# Patient Record
Sex: Female | Born: 1950 | Race: White | Hispanic: No | Smoking: Never smoker
Health system: Southern US, Community
[De-identification: ages and names within clinical notes are randomized; demographics above are authoritative.]

## PROBLEM LIST (undated history)

## (undated) DIAGNOSIS — E7800 Pure hypercholesterolemia, unspecified: Secondary | ICD-10-CM

## (undated) DIAGNOSIS — H269 Unspecified cataract: Secondary | ICD-10-CM

## (undated) DIAGNOSIS — I10 Essential (primary) hypertension: Secondary | ICD-10-CM

## (undated) DIAGNOSIS — E119 Type 2 diabetes mellitus without complications: Secondary | ICD-10-CM

## (undated) DIAGNOSIS — M8580 Other specified disorders of bone density and structure, unspecified site: Secondary | ICD-10-CM

## (undated) HISTORY — PX: OOPHORECTOMY: SHX6387

## (undated) HISTORY — DX: Other specified disorders of bone density and structure, unspecified site: M85.80

## (undated) HISTORY — DX: Unspecified cataract: H26.9

---

## 1998-03-22 ENCOUNTER — Other Ambulatory Visit: Admission: RE | Admit: 1998-03-22 | Discharge: 1998-03-22 | Payer: Self-pay | Admitting: Obstetrics and Gynecology

## 1999-06-10 ENCOUNTER — Other Ambulatory Visit: Admission: RE | Admit: 1999-06-10 | Discharge: 1999-06-10 | Payer: Self-pay | Admitting: Obstetrics and Gynecology

## 2000-11-02 ENCOUNTER — Other Ambulatory Visit: Admission: RE | Admit: 2000-11-02 | Discharge: 2000-11-02 | Payer: Self-pay | Admitting: Obstetrics and Gynecology

## 2001-11-03 ENCOUNTER — Other Ambulatory Visit: Admission: RE | Admit: 2001-11-03 | Discharge: 2001-11-03 | Payer: Self-pay | Admitting: Obstetrics and Gynecology

## 2001-11-04 ENCOUNTER — Ambulatory Visit (HOSPITAL_COMMUNITY): Admission: RE | Admit: 2001-11-04 | Discharge: 2001-11-04 | Payer: Self-pay | Admitting: Obstetrics and Gynecology

## 2001-11-04 ENCOUNTER — Encounter (INDEPENDENT_AMBULATORY_CARE_PROVIDER_SITE_OTHER): Payer: Self-pay

## 2001-11-15 ENCOUNTER — Encounter: Payer: Self-pay | Admitting: Obstetrics and Gynecology

## 2001-11-15 ENCOUNTER — Encounter: Admission: RE | Admit: 2001-11-15 | Discharge: 2001-11-15 | Payer: Self-pay | Admitting: Obstetrics and Gynecology

## 2002-11-08 ENCOUNTER — Other Ambulatory Visit: Admission: RE | Admit: 2002-11-08 | Discharge: 2002-11-08 | Payer: Self-pay | Admitting: Obstetrics and Gynecology

## 2003-11-15 ENCOUNTER — Other Ambulatory Visit: Admission: RE | Admit: 2003-11-15 | Discharge: 2003-11-15 | Payer: Self-pay | Admitting: Obstetrics and Gynecology

## 2004-12-06 ENCOUNTER — Other Ambulatory Visit: Admission: RE | Admit: 2004-12-06 | Discharge: 2004-12-06 | Payer: Self-pay | Admitting: Obstetrics and Gynecology

## 2010-09-27 NOTE — Op Note (Signed)
[facility]  Patient:    Natasha Henry, Natasha Henry [facility] Visit Number: [id number] MRN: [id number]          Service Type: DSU Location: [facility] Attending Physician:  Rhina Brackett Dictated by:   Duke Salvia. Marcelle Overlie, M.D. Proc. Date: 11/04/01 Admit Date:  11/04/2001 Discharge Date: 11/04/2001                             Operative Report  PREOPERATIVE DIAGNOSES:       1. Abnormal uterine bleeding.                               2. Endometrial polyps.  POSTOPERATIVE DIAGNOSES:      1. Abnormal uterine bleeding.                               2. Endometrial polyps.  OPERATION:                    Dilatation and curettage, hysteroscopy.  SURGEON:                      Duke Salvia. Marcelle Overlie, M.D.  ANESTHESIA:                   Paracervical block plus MAC.  DESCRIPTION OF PROCEDURE:     The patient was taken to the operating room and after an adequate level of sedation was obtained with the patients legs in stirrups, the peritoneum and vagina were prepped and draped in the usual manner for D&C.  The bladder was drained. Examination under anesthesia carried out.  The uterus was anterior, normal size.  Adnexa negative.  Speculum was positioned.  The cervix was grasped with a tenaculum.  Paracervical block created by infiltrating at 3 and 9 oclock submucosally.  Then 5-7 cc of 1% Xylocaine on either side after negative aspiration.  The uterus was then sounded to 9 cm and progressively dilated to a 29 Pratt. The continuous flow 7 mm diagnostic hysteroscope was then used to explore the cavity revealing some polypoid type tissue obscuring the view.  D&C was carried out removing a moderate amount of tissue.  After this was completed, the scope was reinserted.  The cavity was rinsed clean and reinspected and noted to be clean.  It was a spacious cavity but was normal.  There were no remaining polyps or abnormalities.  Minimal bleeding.  She tolerated this well and went to  the recovery room in good condition. Dictated by:   Duke Salvia. Marcelle Overlie, M.D. Attending Physician:  Rhina Brackett DD:  11/04/01 TD:  11/06/01 Job: [id number]

## 2010-09-27 NOTE — H&P (Signed)
[facility]  Patient:    Natasha Henry, Natasha Henry [facility] Visit Number: [id number] MRN: [id number]          Service Type: DSU Location: [facility] Attending Physician:  Rhina Brackett Dictated by:   Duke Salvia. Marcelle Overlie, M.D. Admit Date:  11/04/2001                           History and Physical  CHIEF COMPLAINT:              Abnormal bleeding, endometrial polyps.  HISTORY OF PRESENT ILLNESS:   A 60 year old G2 P2 postmenopausal patient who has been on Activelle for perimenopausal vasomotor symptoms.  She has had a prior tubal ligation, recently has had some abnormal bleeding which was investigated by [facility] that showed small posterior fibroid that was approximately 1 cm, adnexa unremarkable, but on saline infusion what appeared to be two small 3.5 to 4 mm well-defined polyps.  She presents at this time for a D&C hysteroscopy.  This procedure including risks of bleeding, infection, other complications that may require open or addition surgery all reviewed with her which she understands and accepts.  PAST MEDICAL HISTORY:  ALLERGIES:                    CODEINE.  MEDICATIONS:                  Activelle, baby aspirin q.d., vitamin E, calcium supplement daily, Claritin, and Flonase.  SURGERY:                      In 1992 had an oophorectomy after a ruptured ovarian cyst.  She has also had a prior D&C for postpartum bleeding.  OBSTETRICAL HISTORY:          Two vaginal deliveries at term without complications.  FAMILY HISTORY:               Significant for her mother and father both with hypertension.  PHYSICAL EXAMINATION:  VITAL SIGNS:                  Temperature 98.2, blood pressure 150/80.  HEENT:                        Unremarkable.  NECK:                         Supple without masses.  LUNGS:                        Clear.  CARDIOVASCULAR:               Regular rate and rhythm without murmurs, rubs, or gallops noted.  BREASTS:                       Without masses.  ABDOMEN:                      Soft, flat, and nontender.  PELVIC:                       Normal external genitalia, vagina and cervix clear.  Uterus midposition, normal size.  Adnexa negative.  IMPRESSION:                   Abnormal uterine bleeding, endometrial  polyps.  PLAN:                         D&C hysteroscopy.  Procedure and risks reviewed as above. Dictated by:   Duke Salvia. Marcelle Overlie, M.D. Attending Physician:  Rhina Brackett DD:  11/03/01 TD:  11/04/01 Job: [id number]

## 2012-03-31 ENCOUNTER — Other Ambulatory Visit: Payer: Self-pay | Admitting: Physician Assistant

## 2012-03-31 DIAGNOSIS — E119 Type 2 diabetes mellitus without complications: Secondary | ICD-10-CM

## 2012-04-05 ENCOUNTER — Other Ambulatory Visit: Payer: Self-pay

## 2012-04-19 ENCOUNTER — Ambulatory Visit
Admission: RE | Admit: 2012-04-19 | Discharge: 2012-04-19 | Disposition: A | Discharge status: Home / Self Care | Payer: BC Managed Care – PPO | Source: Ambulatory Visit | Attending: Physician Assistant | Admitting: Physician Assistant

## 2012-04-19 ENCOUNTER — Other Ambulatory Visit: Payer: Self-pay | Admitting: Physician Assistant

## 2012-04-19 DIAGNOSIS — E119 Type 2 diabetes mellitus without complications: Secondary | ICD-10-CM

## 2013-12-13 ENCOUNTER — Other Ambulatory Visit: Payer: Self-pay | Admitting: Obstetrics and Gynecology

## 2013-12-15 LAB — CYTOLOGY - PAP

## 2014-03-30 ENCOUNTER — Encounter (HOSPITAL_BASED_OUTPATIENT_CLINIC_OR_DEPARTMENT_OTHER): Payer: Self-pay | Admitting: *Deleted

## 2014-03-30 ENCOUNTER — Emergency Department (HOSPITAL_BASED_OUTPATIENT_CLINIC_OR_DEPARTMENT_OTHER): Payer: Worker's Compensation

## 2014-03-30 ENCOUNTER — Emergency Department (HOSPITAL_BASED_OUTPATIENT_CLINIC_OR_DEPARTMENT_OTHER)
Admission: EM | Admit: 2014-03-30 | Discharge: 2014-03-30 | Disposition: A | Discharge status: Home / Self Care | Payer: Worker's Compensation | Attending: Emergency Medicine | Admitting: Emergency Medicine

## 2014-03-30 DIAGNOSIS — Y92218 Other school as the place of occurrence of the external cause: Secondary | ICD-10-CM | POA: Insufficient documentation

## 2014-03-30 DIAGNOSIS — Z79899 Other long term (current) drug therapy: Secondary | ICD-10-CM | POA: Insufficient documentation

## 2014-03-30 DIAGNOSIS — Y998 Other external cause status: Secondary | ICD-10-CM | POA: Diagnosis not present

## 2014-03-30 DIAGNOSIS — S5002XA Contusion of left elbow, initial encounter: Secondary | ICD-10-CM | POA: Diagnosis not present

## 2014-03-30 DIAGNOSIS — I10 Essential (primary) hypertension: Secondary | ICD-10-CM | POA: Insufficient documentation

## 2014-03-30 DIAGNOSIS — S8002XA Contusion of left knee, initial encounter: Secondary | ICD-10-CM | POA: Diagnosis not present

## 2014-03-30 DIAGNOSIS — S8992XA Unspecified injury of left lower leg, initial encounter: Secondary | ICD-10-CM | POA: Diagnosis present

## 2014-03-30 DIAGNOSIS — W010XXA Fall on same level from slipping, tripping and stumbling without subsequent striking against object, initial encounter: Secondary | ICD-10-CM | POA: Insufficient documentation

## 2014-03-30 DIAGNOSIS — W19XXXA Unspecified fall, initial encounter: Secondary | ICD-10-CM

## 2014-03-30 DIAGNOSIS — S93401A Sprain of unspecified ligament of right ankle, initial encounter: Secondary | ICD-10-CM

## 2014-03-30 DIAGNOSIS — Y9301 Activity, walking, marching and hiking: Secondary | ICD-10-CM | POA: Insufficient documentation

## 2014-03-30 DIAGNOSIS — E119 Type 2 diabetes mellitus without complications: Secondary | ICD-10-CM | POA: Diagnosis not present

## 2014-03-30 HISTORY — DX: Essential (primary) hypertension: I10

## 2014-03-30 HISTORY — DX: Type 2 diabetes mellitus without complications: E11.9

## 2014-03-30 MED ORDER — IBUPROFEN 400 MG PO TABS
400.0000 mg | ORAL_TABLET | Freq: Once | ORAL | Status: AC
  Administered 2014-03-30: 400 mg via ORAL
  Filled 2014-03-30: qty 1

## 2014-03-30 NOTE — ED Notes (Signed)
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  Pt. In no distress.  Noted small abrasion noted with bruise on the L elbow and large bruise and abrasion on the L knee.

## 2014-03-30 NOTE — ED Notes (Signed)
No Crutches given to pt.

## 2014-03-30 NOTE — ED Provider Notes (Signed)
CSN: [id number]     Arrival date & time 03/30/14  1921 History   First MD Initiated Contact with Patient 03/30/14 2023     This chart was scribed for No att. providers found by Natasha OrganAshley Henry, ED Scribe. This patient was seen in room MHOTF/OTF and the patient's care was started 12:20 PM.   Chief Complaint  Patient presents with  . Fall   Patient is a 63 y.o. female presenting with fall. The history is provided by the patient. No language interpreter was used.  Fall This is a new problem. The current episode started 6 to 12 hours ago. The problem occurs rarely. The problem has been gradually worsening. Pertinent negatives include no chest pain, no abdominal pain, no headaches and no shortness of breath. The symptoms are relieved by ice. She has tried a cold compress for the symptoms. The treatment provided mild relief.    HPI Comments: Natasha Henry is a 63 y.o. female with a PMHx of DM and HTN who presents to the Emergency Department complaining of a fall that occurred this afternoon at approximately 2:30 PM this afternoon. 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 After fall pt landed in her L knee and L elbow. No head trauma or LOC. She reports small abrasions to the L elbow and L knee along with some bruising. She also c/o constant, moderate pain to L knee and R ankle. Ms. Natasha Henry currently rates pain 6/10. Pt has applied ice to areas with mild improvement for symptoms. She denies any L ankle pain or hip pain. No known allergies to medications.  Past Medical History  Diagnosis Date  . Diabetes mellitus without complication   . Hypertension    Past Surgical History  Procedure Laterality Date  . Oophorectomy     No family history on file. History  Substance Use Topics  . Smoking status: Not on file  . Smokeless tobacco: Not on file  . Alcohol Use: Not on file   OB History    No data available     Review of Systems  Respiratory: Negative for shortness of breath.   Cardiovascular: Negative for chest pain.  Gastrointestinal: Negative for abdominal pain.  Neurological: Negative for headaches.      Allergies  Review of patient's allergies indicates no known allergies.  Home Medications   Prior to Admission medications   Medication Sig Start Date End Date Taking? Authorizing Provider  metFORMIN (GLUCOPHAGE) 500 MG tablet Take by mouth 2 (two) times daily with a meal.   Yes Historical Provider, MD  ramipril (ALTACE) 5 MG capsule Take 5 mg by mouth daily.   Yes Historical Provider, MD  rosuvastatin (CRESTOR) 20 MG tablet Take 20 mg by mouth daily.   Yes Historical Provider, MD  sitaGLIPtin (JANUVIA) 25 MG tablet Take 25 mg by mouth daily.   Yes Historical Provider, MD   Triage Vitals: BP 172/73 mmHg  Pulse 74  Temp(Src) 98.2 F (36.8 C) (Oral)  Resp 18  Ht 5\' 1"  (1.549 m)  Wt 151 lb (68.493 kg)  BMI 28.55 kg/m2  SpO2 100%   Physical Exam  ED Course  Procedures (including critical care time)  DIAGNOSTIC STUDIES:   COORDINATION OF CARE: 12:20 PM-Discussed treatment plan with pt at bedside and pt agreed to plan.     Labs Review Labs Reviewed - No data to display  Imaging Review  Dg Elbow Complete Left  03/30/2014   CLINICAL DATA:  Pain and tenderness at the posterior aspect of the elbow secondary to a fall today. The patient slipped in wet leaves.  EXAM: LEFT ELBOW - COMPLETE 3+ VIEW  COMPARISON:  None.  FINDINGS: There is no evidence of fracture, dislocation, or joint effusion. There is no evidence of arthropathy or other focal bone abnormality. Soft tissues are unremarkable.  IMPRESSION: Normal exam.   Electronically Signed   By: Geanie CooleyJim  Henry M.D.   On: 03/30/2014 21:40   Dg Ankle Complete Right  03/30/2014   CLINICAL DATA:  Slipped and fell while walking. Twisted right ankle. Lateral right ankle pain.  EXAM: RIGHT ANKLE - COMPLETE 3+ VIEW   COMPARISON:  None.  FINDINGS: There is no evidence of fracture, dislocation, or joint effusion. There is no evidence of arthropathy or other focal bone abnormality. Soft tissues are unremarkable.  IMPRESSION: Negative.   Electronically Signed   By: Burman NievesWilliam  Henry M.D.   On: 03/30/2014 21:35   Dg Knee Complete 4 Views Left  03/30/2014   CLINICAL DATA:  Status post fall, slipped on wet leaves. Initial encounter.  EXAM: LEFT KNEE - COMPLETE 4+ VIEW  COMPARISON:  None.  FINDINGS: There is no evidence of fracture, dislocation, or joint effusion. There is no evidence of arthropathy or other focal bone abnormality. Soft tissues are unremarkable.  IMPRESSION: No acute osseous injury of the left knee.   Electronically Signed   By: Natasha KoHetal  Henry   On: 03/30/2014 21:40     EKG Interpretation None      MDM   Final diagnoses:  Fall  Knee contusion, left, initial encounter  Left elbow contusion, initial encounter  Right ankle sprain, initial encounter    Pt is a 63 y.o. female with Pmhx as above who presents with L elbow, L knee, and R ankle pain after mechanical fall. No head injury or LOC. On PE, VSS, pt in NAD. She has contusion over elbow, L patella, and ttp over R lateral malleolus. XR's negative. Will d/c home w/ supportive care. Return precautions given for new or worsening symptoms including worsening pain.    I personally performed the services described in this documentation, which was scribed in my presence. The recorded information has been reviewed and is accurate.    Natasha CookeyMegan Yamen Castrogiovanni, MD 03/31/14 [phone]

## 2014-03-30 NOTE — Discharge Instructions (Signed)
Contusion °A contusion is a deep bruise. Contusions are the result of an injury that caused bleeding under the skin. The contusion may turn blue, purple, or yellow. Minor injuries will give you a painless contusion, but more severe contusions may stay painful and swollen for a few weeks.  °CAUSES  °A contusion is usually caused by a blow, trauma, or direct force to an area of the body. °SYMPTOMS  °· Swelling and redness of the injured area. °· Bruising of the injured area. °· Tenderness and soreness of the injured area. °· Pain. °DIAGNOSIS  °The diagnosis can be made by taking a history and physical exam. An X-ray, CT scan, or MRI may be needed to determine if there were any associated injuries, such as fractures. °TREATMENT  °Specific treatment will depend on what area of the body was injured. In general, the best treatment for a contusion is resting, icing, elevating, and applying cold compresses to the injured area. Over-the-counter medicines may also be recommended for pain control. Ask your caregiver what the best treatment is for your contusion. °HOME CARE INSTRUCTIONS  °· Put ice on the injured area. °¨ Put ice in a plastic bag. °¨ Place a towel between your skin and the bag. °¨ Leave the ice on for 15-20 minutes, 3-4 times a day, or as directed by your health care provider. °· Only take over-the-counter or prescription medicines for pain, discomfort, or fever as directed by your caregiver. Your caregiver may recommend avoiding anti-inflammatory medicines (aspirin, ibuprofen, and naproxen) for 48 hours because these medicines may increase bruising. °· Rest the injured area. °· If possible, elevate the injured area to reduce swelling. °SEEK IMMEDIATE MEDICAL CARE IF:  °· You have increased bruising or swelling. °· You have pain that is getting worse. °· Your swelling or pain is not relieved with medicines. °MAKE SURE YOU:  °· Understand these instructions. °· Will watch your condition. °· Will get help right  away if you are not doing well or get worse. °Document Released: 02/05/2005 Document Revised: 05/03/2013 Document Reviewed: 03/03/2011 °ExitCare® Patient Information ©2015 [employer]. This information is not intended to replace advice given to you by your health care provider. Make sure you discuss any questions you have with your health care provider. ° °

## 2014-11-17 ENCOUNTER — Encounter (HOSPITAL_COMMUNITY): Payer: Self-pay | Admitting: Emergency Medicine

## 2014-11-17 ENCOUNTER — Emergency Department (HOSPITAL_COMMUNITY)
Admission: EM | Admit: 2014-11-17 | Discharge: 2014-11-17 | Disposition: A | Discharge status: Home / Self Care | Payer: BC Managed Care – PPO | Attending: Emergency Medicine | Admitting: Emergency Medicine

## 2014-11-17 ENCOUNTER — Emergency Department (HOSPITAL_COMMUNITY): Payer: BC Managed Care – PPO

## 2014-11-17 DIAGNOSIS — Y92410 Unspecified street and highway as the place of occurrence of the external cause: Secondary | ICD-10-CM | POA: Diagnosis not present

## 2014-11-17 DIAGNOSIS — S4992XA Unspecified injury of left shoulder and upper arm, initial encounter: Secondary | ICD-10-CM | POA: Insufficient documentation

## 2014-11-17 DIAGNOSIS — Y9389 Activity, other specified: Secondary | ICD-10-CM | POA: Diagnosis not present

## 2014-11-17 DIAGNOSIS — Y998 Other external cause status: Secondary | ICD-10-CM | POA: Diagnosis not present

## 2014-11-17 DIAGNOSIS — R0789 Other chest pain: Secondary | ICD-10-CM

## 2014-11-17 DIAGNOSIS — S29001A Unspecified injury of muscle and tendon of front wall of thorax, initial encounter: Secondary | ICD-10-CM | POA: Insufficient documentation

## 2014-11-17 DIAGNOSIS — R079 Chest pain, unspecified: Secondary | ICD-10-CM

## 2014-11-17 DIAGNOSIS — S199XXA Unspecified injury of neck, initial encounter: Secondary | ICD-10-CM | POA: Insufficient documentation

## 2014-11-17 DIAGNOSIS — I10 Essential (primary) hypertension: Secondary | ICD-10-CM | POA: Insufficient documentation

## 2014-11-17 DIAGNOSIS — Z79899 Other long term (current) drug therapy: Secondary | ICD-10-CM | POA: Diagnosis not present

## 2014-11-17 DIAGNOSIS — E119 Type 2 diabetes mellitus without complications: Secondary | ICD-10-CM | POA: Insufficient documentation

## 2014-11-17 DIAGNOSIS — M542 Cervicalgia: Secondary | ICD-10-CM

## 2014-11-17 HISTORY — DX: Pure hypercholesterolemia, unspecified: E78.00

## 2014-11-17 MED ORDER — IBUPROFEN 800 MG PO TABS: 800.0000 mg | ORAL_TABLET | Freq: Three times a day (TID) | ORAL | Status: DC

## 2014-11-17 MED ORDER — CYCLOBENZAPRINE HCL 10 MG PO TABS: 10.0000 mg | ORAL_TABLET | Freq: Two times a day (BID) | ORAL | Status: AC | PRN

## 2014-11-17 MED ORDER — ONDANSETRON HCL 4 MG PO TABS
4.0000 mg | ORAL_TABLET | Freq: Once | ORAL | Status: AC
  Administered 2014-11-17: 4 mg via ORAL
  Filled 2014-11-17: qty 1

## 2014-11-17 MED ORDER — DIAZEPAM 5 MG PO TABS: 5.0000 mg | ORAL_TABLET | Freq: Two times a day (BID) | ORAL | Status: DC

## 2014-11-17 MED ORDER — HYDROCODONE-ACETAMINOPHEN 5-325 MG PO TABS
2.0000 | ORAL_TABLET | Freq: Once | ORAL | Status: AC
  Administered 2014-11-17: 2 via ORAL
  Filled 2014-11-17: qty 2

## 2014-11-17 MED ORDER — HYDROCODONE-ACETAMINOPHEN 5-325 MG PO TABS: 2.0000 | ORAL_TABLET | ORAL | Status: DC | PRN

## 2014-11-17 MED ORDER — DIAZEPAM 5 MG/ML IJ SOLN
5.0000 mg | Freq: Once | INTRAMUSCULAR | Status: AC
  Administered 2014-11-17: 5 mg via INTRAMUSCULAR
  Filled 2014-11-17: qty 2
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
## 2021-10-14 ENCOUNTER — Encounter: Payer: Self-pay | Admitting: Cardiology

## 2021-10-14 ENCOUNTER — Ambulatory Visit (INDEPENDENT_AMBULATORY_CARE_PROVIDER_SITE_OTHER): Payer: Medicare PPO | Admitting: Cardiology

## 2021-10-14 VITALS — BP 126/64 | HR 98 | Ht 61.0 in | Wt 138.6 lb

## 2021-10-14 DIAGNOSIS — E8881 Metabolic syndrome: Secondary | ICD-10-CM

## 2021-10-14 DIAGNOSIS — I10 Essential (primary) hypertension: Secondary | ICD-10-CM

## 2021-10-14 DIAGNOSIS — R011 Cardiac murmur, unspecified: Secondary | ICD-10-CM

## 2021-10-14 NOTE — Patient Instructions (Addendum)
Medication Instructions:   No changes     Lab Work: Not needed    Testing/Procedures: Will be schedule at [facility] [address] Your physician has requested that you have an echocardiogram. Echocardiography is a painless test that uses sound waves to create images of your heart. It provides your doctor with information about the size and shape of your heart and how well your heart's chambers and valves are working. This procedure takes approximately one hour. There are no restrictions for this procedure.   Follow-Up: At [facility], you and your health needs are our priority.  As part of our continuing mission to provide you with exceptional heart care, we have created designated Provider Care Teams.  These Care Teams include your primary Cardiologist (physician) and Advanced Practice Providers (APPs -  Physician Assistants and Nurse Practitioners) who all work together to provide you with the care you need, when you need it.     Your next appointment:   4 month(s)  The format for your next appointment:   In Person  Provider:   Bryan Lemma, MD

## 2021-10-14 NOTE — Progress Notes (Signed)
Primary Care Provider: Iona Hansen, NP [facility] Cardiologist: Bryan Lemma, MD Electrophysiologist: None  Clinic Note: Chief Complaint  Patient presents with   New Patient (Initial Visit)    Evaluate murmur    ===================================  ASSESSMENT/PLAN   Problem List Items Addressed This Visit       Cardiology Problems   Essential hypertension    Blood pressure is pretty well controlled on amlodipine.      Relevant Medications   amLODipine (NORVASC) 5 MG tablet   aspirin EC 81 MG tablet   ezetimibe (ZETIA) 10 MG tablet   Other Relevant Orders   ECHOCARDIOGRAM COMPLETE     Other   Metabolic syndrome (Chronic)    Hypertension, diabetes-2, and Hyperlipidemia with triglycerides 242.  Cholesterol levels very poorly controlled.  Following echocardiogram results can discuss potential screening with coronary calcium score.  She is only on Zetia for lipids.  She is on Janumet for diabetes with an A1c of 6.7 so she still has room to go Blood pressure is pretty well controlled with amlodipine and ramipril.  Stressed importance of staying active and exercising.  Monitor blood pressure.  I think her major untreated risk factor is hyperlipidemia.      Heart murmur - Primary    Very soft systolic ejection murmur.  The location would suggest aortic murmur.  Rheumatic heart disease is not inherited making this unlikely.  My suspicion is this could be a flow murmur from her anemia.  Offered to exclude any severe valvular disease or ASD/PFO, we will check 2D echo.      Relevant Orders   ECHOCARDIOGRAM COMPLETE    ===================================  HPI:    Natasha Henry is a 71 y.o. female with HTN, HLD and DM-2 MURMUR who is being seen today for the evaluation of HEART MURMUR at the request of Iona Hansen, NP.  Natasha Henry was seen on May by her PCP for routine follow-up of her blood pressure.  Noted a murmur on exam, referred for  cardiology evaluation.  Recent Hospitalizations: None  Reviewed  CV studies:    The following studies were reviewed today: (if available, images/films reviewed: From Epic Chart or Care Everywhere) No previous studies:  Interval History:   Natasha Henry presents here for cardiology evaluation really with no active cardiac symptoms.  She has a lot of anxiety and some dysthymia symptoms.  She does have significant cardiac risk factors of hypertension diabetes and hyperlipidemia.  She says that her PCP has heard a murmur for few years now but no one else has mentioned that.  She was never told she had a murmur before.  The only person that she did had a murmur was her maternal Grandmother had aortic valve replacement for rheumatic heart disease-having had rheumatic fever as a child.  Her mother has hypertension.  Father has diabetes hypertension CAD but was a long-term smoker.  Unfortunately, physical exam on the referral note indicates presence of murmur but does not describe it.  She denies any exertional dyspnea or chest tightness or pressure.  She is not pretty active doing gardening and enjoys walking.  Only maybe for 5 times a year does she wake up short of breath if she is laying on her left side at night..  She did have some elevated blood pressures but they are doing much better since she is been on amlodipine.  She was told she had some mild anemia  CV Review of Symptoms (Summary) Cardiovascular  ROS: no chest pain or dyspnea on exertion positive for - -rare off-and-on palpitations usually occur at night. negative for - chest pain, dyspnea on exertion, edema, irregular heartbeat, orthopnea, paroxysmal nocturnal dyspnea, rapid heart rate, shortness of breath, or syncope/near syncope or TIA/amaurosis fugax, claudication  REVIEWED OF SYSTEMS   Review of Systems  Constitutional:  Negative for malaise/fatigue and weight loss.  HENT:  Negative for nosebleeds.   Respiratory:   Negative for cough and shortness of breath.   Cardiovascular:        Per HPI  Gastrointestinal:  Negative for blood in stool and melena.  Genitourinary:  Negative for dysuria and hematuria.  Musculoskeletal:  Negative for back pain and falls.  Neurological:  Negative for dizziness and focal weakness.  Psychiatric/Behavioral: Negative.    All other systems reviewed and are negative.  I have reviewed and (if needed) personally updated the patient's problem list, medications, allergies, past medical and surgical history, social and family history.   PAST MEDICAL HISTORY   Past Medical History:  Diagnosis Date   Cataracts, bilateral    Diabetes mellitus without complication (HCC)    Hypercholesterolemia    Hypertension    Osteopenia     PAST SURGICAL HISTORY   Past Surgical History:  Procedure Laterality Date   OOPHORECTOMY      There is no immunization history on file for this patient.  MEDICATIONS/ALLERGIES   Current Meds  Medication Sig   albuterol (VENTOLIN HFA) 108 (90 Base) MCG/ACT inhaler Inhale 2 puffs into the lungs every 6 (six) hours as needed.   amLODipine (NORVASC) 5 MG tablet Take 1 tablet by mouth daily.   aspirin EC 81 MG tablet Take 1 tablet by mouth daily.   Calcium Carbonate-Vitamin D 600-3.125 MG-MCG TABS Take by mouth.   Chromium-Cinnamon (CINNAMON PLUS CHROMIUM) [phone] MCG-MG CAPS Take by mouth.   citalopram (CELEXA) 20 MG tablet Take 1 1/2 tablets daily   Cranberry 500 MG CAPS Take by mouth.   cyclobenzaprine (FLEXERIL) 10 MG tablet Take 1 tablet (10 mg total) by mouth 2 (two) times daily as needed for muscle spasms.   ezetimibe (ZETIA) 10 MG tablet TAKE 1 TABLET(10 MG) BY MOUTH DAILY   Garlic 2000 MG TBEC Take by mouth.   Ginkgo Biloba 40 MG TABS Take by mouth.   hydrocortisone 2.5 % cream APPLY THIN LAYER TOPICALLY TO THE AFFECTED AREA 2 TO 4 TIMES DAILY   LORazepam (ATIVAN) 1 MG tablet TAKE 1/2 TABLET(0.5 MG) BY MOUTH DAILY AS NEEDED   Misc  Natural Products (YUMVS BEET ROOT-TART CHERRY) 250-0.5 MG CHEW Chew by mouth.   ramipril (ALTACE) 5 MG capsule Take 5 mg by mouth daily.   SitaGLIPtin-MetFORMIN HCl (JANUMET XR) 50-1000 MG TB24 Take 2 tablets by mouth daily.    No Known Allergies  SOCIAL HISTORY/FAMILY HISTORY   Reviewed in Epic:   Social History   Tobacco Use   Smoking status: Never  Substance Use Topics   Alcohol use: No   Drug use: Never   Social History   Social History Narrative   She is a married mother of 2 (daughter-39 and son-42) as of June 2023   She had 2 grandchildren from her daughter   She is relatively active at work, and also does enjoy gardening and walking.   Family History  Problem Relation Age of Onset   Hypertension Mother    Diabetes Mellitus II Mother    Lung cancer Mother    Hypertension Father  Heart attack Father 24       Long-term smoker   Coronary artery disease Father 27   Diabetes Mellitus II Father    Rheumatic fever Maternal Grandmother    Valvular heart disease Maternal Grandmother        History of AVR for rheumatic heart disease/ALS    OBJCTIVE -PE, EKG, labs   Wt Readings from Last 3 Encounters:  10/14/21 138 lb 9.6 oz (62.9 kg)  03/30/14 151 lb (68.5 kg)    Physical Exam: BP 126/64   Pulse 98   Ht 5\' 1"  (1.549 m)   Wt 138 lb 9.6 oz (62.9 kg)   SpO2 98%   BMI 26.19 kg/m  Physical Exam Vitals reviewed.  Constitutional:      General: She is not in acute distress.    Appearance: Normal appearance. She is normal weight. She is not ill-appearing or toxic-appearing.  HENT:     Head: Normocephalic and atraumatic.  Neck:     Vascular: No carotid bruit.  Cardiovascular:     Rate and Rhythm: Normal rate and regular rhythm.     Pulses: Normal pulses.     Heart sounds: Murmur (Very faint ~1/6 SEM at RUSB) heard.     No friction rub. No gallop.  Pulmonary:     Effort: Pulmonary effort is normal. No respiratory distress.     Breath sounds: Normal breath  sounds. No wheezing, rhonchi or rales.  Chest:     Chest wall: No tenderness.  Abdominal:     General: Abdomen is flat. Bowel sounds are normal. There is no distension.     Palpations: Abdomen is soft. There is no mass (No HSM or bruit).     Tenderness: There is no abdominal tenderness. There is no guarding.  Musculoskeletal:        General: No swelling. Normal range of motion.     Cervical back: Normal range of motion and neck supple.  Skin:    General: Skin is warm and dry.  Neurological:     General: No focal deficit present.     Mental Status: She is alert and oriented to person, place, and time.     Motor: No weakness.  Psychiatric:        Mood and Affect: Mood normal.        Behavior: Behavior normal.        Thought Content: Thought content normal.        Judgment: Judgment normal.     Adult ECG Report reviewed from PCP dated 08/12/2021  Rate: 83;  Rhythm: normal sinus rhythm and left axis deviation (-40 ) ; otherwise normal intervals durations and voltage.  Narrative Interpretation: Essentially normal EKG  Recent Labs:    [facility] Psychiatric Related to Lipid Profile Component 10/03/21  08/23/20  05/23/19   Total Cholesterol 268 High  233 High  284 High   Triglycerides 242 High  190 High  313 High   HDL Cholesterol 70 60 75  LDL Cholesterol Calculated 150 High  135 High  146 High   Hemoglobin A1C Component 08/12/21  04/02/21  11/29/20  08/23/20  11/22/19  05/23/19   HEMOGLOBIN A1C 6.7 High   7.1 High   6.6 High         Ferritin Component 10/03/21  08/12/21  04/02/21   Ferritin 4.0 Low  4.4 Low  5.7 Low   CBC and Differential Component 10/03/21  08/23/20   WBC 8.4 7.6   RBC  4.60 4.32  Hemoglobin 10.3 Low  9.9 Low   Hematocrit 33.1 Low  30.1 Low   MCV 72.0 Low  69.7 Low   MCH 22.4 Low  22.9 Low   MCHC 31.1 Low  32.9 Low   RDW 16.1 16.1  Platelets 395 453 High    Comprehensive Metabolic Panel Component 08/12/21  04/02/21  11/29/20   Sodium 137  138 139  Potassium 4.4 4.5 4.9  Chloride 103 102 105  CO2 BUN Glucose 106 High   116 High   109 High    Creatinine 0.71 0.75 0.73  Calcium 9.8 9.8 10.3  Total Protein 6.7  -- --  Albumin  4.1 -- --  Total Bilirubin 0.3  -- --  Alkaline Phosphatase 51 -- --  AST (SGOT) 15 -- --  ALT (SGPT) 11 -- --  Anion Gap Est. GFR >90  86  89    Comprehensive Metabolic Panel Component 08/12/21  04/02/21  Kyle arrived to me 11/29/20   Sodium 137 138 139  Potassium 4.4 4.5 4.9  Chloride 103 102 105  CO2 BUN Glucose 106 High   116 High   109 High    Creatinine 0.71 0.75 0.73  Calcium 9.8 9.8 10.3  Total Protein 6.7  -- --  Albumin  4.1 -- --  Total Bilirubin 0.3  -- --  Alkaline Phosphatase 51 -- --  AST (SGOT) 15 -- --  ALT (SGPT) 11 -- --  Anion Gap Est. GFR >90  86  89     ==================================================  COVID-19 Education: The signs and symptoms of COVID-19 were discussed with the patient and how to seek care for testing (follow up with PCP or arrange E-visit).    I spent a total of 22 minutes with the patient spent in direct patient consultation.  Additional time spent with chart review  / charting (studies, outside notes, etc): 20 min Total Time: 42 min  Current medicines are reviewed at length with the patient today.  (+/- concerns) N/A  This visit occurred during the SARS-CoV-2 public health emergency.  Safety protocols were in place, including screening questions prior to the visit, additional usage of staff PPE, and extensive cleaning of exam room while observing appropriate contact time as indicated for disinfecting solutions.  Notice: This dictation was prepared with Dragon dictation along with smart phrase technology. Any transcriptional errors that result from this process are unintentional and may not be corrected upon review.   Studies Ordered:  Orders Placed This Encounter  Procedures    ECHOCARDIOGRAM COMPLETE   No orders of the defined types were placed in this encounter.   Patient Instructions / Medication Changes & Studies & Tests Ordered   Patient Instructions  Medication Instructions:   No changes     Lab Work: Not needed    Testing/Procedures: Will be schedule at [facility] [address] Your physician has requested that you have an echocardiogram. Echocardiography is a painless test that uses sound waves to create images of your heart. It provides your doctor with information about the size and shape of your heart and how well your heart's chambers and valves are working. This procedure takes approximately one hour. There are no restrictions for this procedure.   Follow-Up: At [facility], you and your health needs are our priority.  As part of our continuing  mission to provide you with exceptional heart care, we have created designated Provider Care Teams.  These Care Teams include your primary Cardiologist (physician) and Advanced Practice Providers (APPs -  Physician Assistants and Nurse Practitioners) who all work together to provide you with the care you need, when you need it.     Your next appointment:   4 month(s)  The format for your next appointment:   In Person  Provider:   Bryan Lemmaavid Advit Trethewey, MD        Bryan Lemmaavid Brittanie Dosanjh, M.D., M.S. Interventional Cardiologist   Pager # [phone] Phone # [phone] [address] [place], [place] [address]   Thank you for choosing [facility]!!

## 2021-10-19 ENCOUNTER — Encounter: Payer: Self-pay | Admitting: Cardiology

## 2021-10-19 DIAGNOSIS — E8881 Metabolic syndrome: Secondary | ICD-10-CM | POA: Insufficient documentation

## 2021-10-19 NOTE — Assessment & Plan Note (Signed)
Hypertension, diabetes-2, and Hyperlipidemia with triglycerides 242.  Cholesterol levels very poorly controlled.  Following echocardiogram results can discuss potential screening with coronary calcium score.  She is only on Zetia for lipids.  She is on Janumet for diabetes with an A1c of 6.7 so she still has room to go Blood pressure is pretty well controlled with amlodipine and ramipril.  Stressed importance of staying active and exercising.  Monitor blood pressure.  I think her major untreated risk factor is hyperlipidemia.

## 2021-10-19 NOTE — Assessment & Plan Note (Signed)
Very soft systolic ejection murmur.  The location would suggest aortic murmur.  Rheumatic heart disease is not inherited making this unlikely.  My suspicion is this could be a flow murmur from her anemia.  Offered to exclude any severe valvular disease or ASD/PFO, we will check 2D echo.

## 2021-10-19 NOTE — Assessment & Plan Note (Signed)
Blood pressure is pretty well controlled on amlodipine.

## 2021-10-24 ENCOUNTER — Ambulatory Visit (INDEPENDENT_AMBULATORY_CARE_PROVIDER_SITE_OTHER): Payer: Medicare PPO

## 2021-10-24 DIAGNOSIS — I10 Essential (primary) hypertension: Secondary | ICD-10-CM | POA: Diagnosis not present

## 2021-10-24 DIAGNOSIS — R011 Cardiac murmur, unspecified: Secondary | ICD-10-CM

## 2021-10-24 LAB — ECHOCARDIOGRAM COMPLETE
Area-P 1/2: 2.7 cm2
S' Lateral: 2.37 cm

## 2022-02-17 ENCOUNTER — Ambulatory Visit: Payer: Medicare PPO | Admitting: Cardiology

## 2022-03-25 ENCOUNTER — Telehealth: Payer: Self-pay | Admitting: Hematology and Oncology

## 2022-03-25 NOTE — Telephone Encounter (Signed)
Scheduled appointment per 11/14 referal. Patient is aware of appointment date and time. Patient is aware to arrive 15 mins prior to appointment time and to bring updated insurance cards. Patient is aware of location.

## 2022-04-11 ENCOUNTER — Inpatient Hospital Stay: Payer: Medicare PPO | Admitting: Hematology and Oncology

## 2022-04-11 ENCOUNTER — Inpatient Hospital Stay: Payer: Medicare PPO | Attending: Hematology and Oncology

## 2022-04-11 VITALS — BP 165/66 | HR 90 | Temp 97.6°F | Resp 16 | Wt 144.9 lb

## 2022-04-11 DIAGNOSIS — D509 Iron deficiency anemia, unspecified: Secondary | ICD-10-CM

## 2022-04-11 DIAGNOSIS — Z79899 Other long term (current) drug therapy: Secondary | ICD-10-CM | POA: Diagnosis not present

## 2022-04-11 DIAGNOSIS — E119 Type 2 diabetes mellitus without complications: Secondary | ICD-10-CM

## 2022-04-11 DIAGNOSIS — Z7984 Long term (current) use of oral hypoglycemic drugs: Secondary | ICD-10-CM | POA: Insufficient documentation

## 2022-04-11 DIAGNOSIS — Z7982 Long term (current) use of aspirin: Secondary | ICD-10-CM | POA: Diagnosis not present

## 2022-04-11 DIAGNOSIS — I10 Essential (primary) hypertension: Secondary | ICD-10-CM | POA: Insufficient documentation

## 2022-04-11 DIAGNOSIS — M8580 Other specified disorders of bone density and structure, unspecified site: Secondary | ICD-10-CM | POA: Insufficient documentation

## 2022-04-11 DIAGNOSIS — D500 Iron deficiency anemia secondary to blood loss (chronic): Secondary | ICD-10-CM

## 2022-04-11 DIAGNOSIS — E7800 Pure hypercholesterolemia, unspecified: Secondary | ICD-10-CM

## 2022-04-11 LAB — CMP (CANCER CENTER ONLY)
ALT: 17 U/L (ref 0–44)
AST: 17 U/L (ref 15–41)
Albumin: 4.7 g/dL (ref 3.5–5.0)
Alkaline Phosphatase: 56 U/L (ref 38–126)
Anion gap: 7 (ref 5–15)
BUN: 11 mg/dL (ref 8–23)
CO2: 28 mmol/L (ref 22–32)
Calcium: 10 mg/dL (ref 8.9–10.3)
Chloride: 103 mmol/L (ref 98–111)
Creatinine: 0.7 mg/dL (ref 0.44–1.00)
GFR, Estimated: >60 mL/min (ref >60)
Glucose, Bld: 100 mg/dL — ABNORMAL HIGH (ref 70–99)
Potassium: 4.3 mmol/L (ref 3.5–5.1)
Sodium: 138 mmol/L (ref 135–145)
Total Bilirubin: 0.3 mg/dL (ref 0.3–1.2)
Total Protein: 7.6 g/dL (ref 6.5–8.1)

## 2022-04-11 LAB — RENAL FUNCTION PANEL
Albumin: 4.4 g/dL (ref 3.5–5.0)
Anion gap: 8 (ref 5–15)
BUN: 12 mg/dL (ref 8–23)
CO2: 27 mmol/L (ref 22–32)
Calcium: 9.6 mg/dL (ref 8.9–10.3)
Chloride: 103 mmol/L (ref 98–111)
Creatinine, Ser: 0.68 mg/dL (ref 0.44–1.00)
GFR, Estimated: >60 mL/min (ref >60)
Glucose, Bld: 99 mg/dL (ref 70–99)
Phosphorus: 3.8 mg/dL (ref 2.5–4.6)
Potassium: 4.4 mmol/L (ref 3.5–5.1)
Sodium: 138 mmol/L (ref 135–145)

## 2022-04-11 LAB — CBC WITH DIFFERENTIAL (CANCER CENTER ONLY)
Abs Immature Granulocytes: 0.04 10*3/uL (ref 0.00–0.07)
Basophils Absolute: 0.1 10*3/uL (ref 0.0–0.1)
Basophils Relative: 1 %
Eosinophils Absolute: 0.3 10*3/uL (ref 0.0–0.5)
Eosinophils Relative: 4 %
HCT: 34.3 % — ABNORMAL LOW (ref 36.0–46.0)
Hemoglobin: 10.5 g/dL — ABNORMAL LOW (ref 12.0–15.0)
Immature Granulocytes: 1 %
Lymphocytes Relative: 33 %
Lymphs Abs: 2.7 10*3/uL (ref 0.7–4.0)
MCH: 23.3 pg — ABNORMAL LOW (ref 26.0–34.0)
MCHC: 30.6 g/dL (ref 30.0–36.0)
MCV: 76.2 fL — ABNORMAL LOW (ref 80.0–100.0)
Monocytes Absolute: 0.5 10*3/uL (ref 0.1–1.0)
Monocytes Relative: 6 %
Neutro Abs: 4.6 10*3/uL (ref 1.7–7.7)
Neutrophils Relative %: 55 %
Platelet Count: 442 10*3/uL — ABNORMAL HIGH (ref 150–400)
RBC: 4.5 MIL/uL (ref 3.87–5.11)
RDW: 15.5 % (ref 11.5–15.5)
WBC Count: 8.3 10*3/uL (ref 4.0–10.5)
nRBC: 0 % (ref 0.0–0.2)

## 2022-04-11 LAB — FERRITIN: Ferritin: 3 ng/mL — ABNORMAL LOW (ref 11–307)

## 2022-04-11 LAB — IRON AND IRON BINDING CAPACITY (CC-WL,HP ONLY)
Iron: 9 ug/dL — ABNORMAL LOW (ref 28–170)
Saturation Ratios: 2 % — ABNORMAL LOW (ref 10.4–31.8)
TIBC: 552 ug/dL — ABNORMAL HIGH (ref 250–450)
UIBC: 543 ug/dL — ABNORMAL HIGH (ref 148–442)

## 2022-04-11 MED ORDER — FERROUS SULFATE 325 (65 FE) MG PO TABS: 325.0000 mg | ORAL_TABLET | Freq: Every day | ORAL | 3 refills | Status: DC

## 2022-04-11 NOTE — Progress Notes (Signed)
[employer] [phone]   Fax:(336) [place] NOTE  Patient Care Team: Berkley Harvey, NP as PCP - General (Nurse Practitioner) Leonie Man, MD as PCP - Cardiology (Cardiology)  Hematological/Oncological History # Iron Deficiency Anemia of Unclear Etiology  01/24/2022: Ferritin 3.0, Sat 4.0%, TIBC 496, Hgb 10.5, MCV 73.7, WBC 7.2, Plt 363 04/11/2022: Establish care with Dr. Lorenso Courier  CHIEF COMPLAINTS/PURPOSE OF CONSULTATION:  "Iron Deficiency Anemia "  HISTORY OF PRESENTING ILLNESS:  Natasha Henry 71 y.o. female with medical history significant for DM type II, HTN, HLD, and osteopenia who presents for evaluation of iron deficiency anemia.   On review of the previous records Natasha Henry last had labs on 01/24/2022 which showed ferritin 3.0, iron sat 4.0%, total iron-binding capacity 496, hemoglobin 10.5, and MCV 73.3.  Due to concern for these findings he was referred to hematology for further evaluation and management.  On exam today Natasha Henry reports that she has been having issues with the iron for approximate last 6 months.  She reports he is undergone 2 rounds of labs without much improvement.  She has been taking a multivitamin and added spinach/greens to her diet with very little difference.  She denies any overt signs of bleeding.  She has no bleeding, bruising, or dark stools.  She reports that she undergoes fecal occult blood testing and is currently due for this.  She prefers fecal occult blood testing because she underwent a colonoscopy 13 to 14 years ago and woke up during the procedure.  It was immensely painful and traumatic.  She reports that she does not have any dietary restrictions but does not eat a whole lot of meat.  She is not quite vegetarian and eats very little in the way of chicken and fish.  She reports that she is never taken iron pills before.  On further discussion she reports that her father died of a heart  attack at age 70 and her mother passed away of lung cancer though she was not an active smoker.  She has 1 brother and 4 sisters who are healthy.  She has 2 healthy children.  She reports that she is a never smoker but does drink occasionally, about 2 times per year.  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  She does Dors having fatigue which has been improving lately.  She is not having any shortness of breath, chest pain, lightheadedness, or dizziness.  She also denies any fevers, chills, sweats, nausea, vomiting or diarrhea.  A full 10 point ROS was otherwise negative.  MEDICAL HISTORY:  Past Medical History:  Diagnosis Date   Cataracts, bilateral    Diabetes mellitus without complication ([place])    Hypercholesterolemia    Hypertension    Osteopenia     SURGICAL HISTORY: Past Surgical History:  Procedure Laterality Date   OOPHORECTOMY      SOCIAL HISTORY: Social History   Socioeconomic History   Marital status: Widowed    Spouse name: Not on file   Number of children: 2   Years of education: Not on file   Highest education level: Not on file  Occupational History    Employer: [employer]  Tobacco Use   Smoking status: Never   Smokeless tobacco: Not on file  Substance and Sexual Activity   Alcohol use: No   Drug use: Never   Sexual activity: Yes    Partners: Male  Other Topics Concern  Not on file  Social History Narrative   She is a married mother of 2 (daughter-39 and son-42) as of June 2023   She had 2 grandchildren from her daughter   She is relatively active at work, and also does enjoy gardening and walking.   Social Determinants of Health   Financial Resource Strain: Not on file  Food Insecurity: Not on file  Transportation Needs: Not on file  Physical Activity: Not on file  Stress: Not on file  Social Connections: Not on file  Intimate Partner Violence: Not on file    FAMILY HISTORY: Family History  Problem  Relation Age of Onset   Hypertension Mother    Diabetes Mellitus II Mother    Lung cancer Mother    Hypertension Father    Heart attack Father 40       Long-term smoker   Coronary artery disease Father 13   Diabetes Mellitus II Father    Rheumatic fever Maternal Grandmother    Valvular heart disease Maternal Grandmother        History of AVR for rheumatic heart disease/ALS    ALLERGIES:  is allergic to other, sulfa antibiotics, and penicillins.  MEDICATIONS:  Current Outpatient Medications  Medication Sig Dispense Refill   Magnesium 500 MG CAPS Take by mouth.     Turmeric (QC TUMERIC COMPLEX) 500 MG CAPS Take by mouth.     albuterol (VENTOLIN HFA) 108 (90 Base) MCG/ACT inhaler Inhale 2 puffs into the lungs every 6 (six) hours as needed.     amLODipine (NORVASC) 5 MG tablet Take 1 tablet by mouth daily.     aspirin EC 81 MG tablet Take 1 tablet by mouth daily.     Calcium Carbonate-Vitamin D 600-3.125 MG-MCG TABS Take by mouth.     Chromium-Cinnamon (CINNAMON PLUS CHROMIUM) [phone] MCG-MG CAPS Take by mouth.     citalopram (CELEXA) 20 MG tablet Take 1 1/2 tablets daily     Cranberry 500 MG CAPS Take by mouth.     cyclobenzaprine (FLEXERIL) 10 MG tablet Take 1 tablet (10 mg total) by mouth 2 (two) times daily as needed for muscle spasms. 20 tablet 0   ezetimibe (ZETIA) 10 MG tablet TAKE 1 TABLET(10 MG) BY MOUTH DAILY     Garlic [id number] MG TBEC Take by mouth.     Ginkgo Biloba 40 MG TABS Take by mouth.     hydrocortisone 2.5 % cream APPLY THIN LAYER TOPICALLY TO THE AFFECTED AREA 2 TO 4 TIMES DAILY     LORazepam (ATIVAN) 1 MG tablet TAKE 1/2 TABLET(0.5 MG) BY MOUTH DAILY AS NEEDED     Misc Natural Products (YUMVS BEET ROOT-TART CHERRY) 250-0.5 MG CHEW Chew by mouth.     omeprazole (PRILOSEC) 40 MG capsule Take 40 mg by mouth daily.     ramipril (ALTACE) 10 MG capsule Take 10 mg by mouth 2 (two) times daily.     SitaGLIPtin-MetFORMIN HCl (JANUMET XR) 50-1000 MG TB24 Take 2 tablets by  mouth daily.     No current facility-administered medications for this visit.    REVIEW OF SYSTEMS:   Constitutional: ( - ) fevers, ( - )  chills , ( - ) night sweats Eyes: ( - ) blurriness of vision, ( - ) double vision, ( - ) watery eyes Ears, nose, mouth, throat, and face: ( - ) mucositis, ( - ) sore throat Respiratory: ( - ) cough, ( - ) dyspnea, ( - ) wheezes Cardiovascular: ( - )  palpitation, ( - ) chest discomfort, ( - ) lower extremity swelling Gastrointestinal:  ( - ) nausea, ( - ) heartburn, ( - ) change in bowel habits Skin: ( - ) abnormal skin rashes Lymphatics: ( - ) new lymphadenopathy, ( - ) easy bruising Neurological: ( - ) numbness, ( - ) tingling, ( - ) new weaknesses Behavioral/Psych: ( - ) mood change, ( - ) new changes  All other systems were reviewed with the patient and are negative.  PHYSICAL EXAMINATION:  Vitals:   04/11/22 1304 04/11/22 1317  BP: (!) 165/66 (!) 165/66  Pulse: 90 90  Resp: 16 16  Temp: 97.6 F (36.4 C) 97.6 F (36.4 C)  SpO2: 100% 100%   Filed Weights   04/11/22 1304 04/11/22 1317  Weight: 144 lb 14.4 oz (65.7 kg) 144 lb 14.4 oz (65.7 kg)    GENERAL: well appearing elderly Caucasian female in NAD  SKIN: skin color, texture, turgor are normal, no rashes or significant lesions EYES: conjunctiva are pink and non-injected, sclera clear LUNGS: clear to auscultation and percussion with normal breathing effort HEART: regular rate & rhythm and no murmurs and no lower extremity edema Musculoskeletal: no cyanosis of digits and no clubbing  PSYCH: alert & oriented x 3, fluent speech NEURO: no focal motor/sensory deficits  LABORATORY DATA:  I have reviewed the data as listed     No data to display              No data to display           Natasha Henry 71 y.o. female with medical history significant for DM type II, HTN, HLD, and osteopenia who presents for evaluation of iron deficiency anemia.    After review of the labs, review of the records, and discussion with the patient the patients findings are most consistent with iron deficiency anemia of unclear etiology.  At this time the differential includes GI bleed versus poor p.o. dietary intake.  The patient does believe that she does not intake enough iron rich foods as she does not eat a lot of meat.  She has fecal occult blood testing yearly as she had a bad experience with colonoscopy 13 to 14 years ago.  She is currently due for this testing.  If positive would recommend EGD and colonoscopy for further evaluation.  If negative can continue with monitoring with p.o. iron therapy.  The patient voiced understanding of the plan moving forward.  # Iron Deficiency Anemia 2/2 to Unclear Etiology -- Findings are consistent with iron deficiency anemia secondary an unclear etiology.  --patient undergoes testing with FOB cards. Discussed that colonoscopy will be required if the testing is positive.  She is currently due for testing. --We will confirm iron deficiency anemia by ordering iron panel and ferritin as well as reticulocytes, CBC, and CMP --recommend starting ferrous sulfate 325 mg daily with a source of vitamin C --Plan for return to clinic in 3 months time to reassess.   No orders of the defined types were placed in this encounter.  All questions were answered. The patient knows to call the clinic with any problems, questions or concerns.  A total of more than 60 minutes were spent on this encounter with face-to-face time and non-face-to-face time, including preparing to see the patient, ordering tests and/or medications, counseling the patient and coordination of care as outlined above.   Ledell Peoples, MD Department of Hematology/Oncology [facility] at  [facility] Phone: [phone] Pager: [phone] Email: [email or web address]  04/11/2022 1:22 PM

## 2022-05-18 LAB — COLOGUARD: COLOGUARD: NEGATIVE

## 2022-05-19 ENCOUNTER — Telehealth: Payer: Self-pay | Admitting: Hematology and Oncology

## 2022-05-19 NOTE — Telephone Encounter (Signed)
Called patient to r/s 3/5 appointment due to provider PAL. Left voicemail with new appointment information.

## 2022-07-15 ENCOUNTER — Inpatient Hospital Stay: Payer: Medicare PPO

## 2022-07-15 ENCOUNTER — Inpatient Hospital Stay: Payer: Medicare PPO | Admitting: Hematology and Oncology

## 2022-07-25 ENCOUNTER — Other Ambulatory Visit: Payer: Self-pay | Admitting: Hematology and Oncology

## 2022-07-25 ENCOUNTER — Inpatient Hospital Stay (HOSPITAL_BASED_OUTPATIENT_CLINIC_OR_DEPARTMENT_OTHER): Payer: Medicare Other | Admitting: Hematology and Oncology

## 2022-07-25 ENCOUNTER — Inpatient Hospital Stay: Payer: Medicare Other | Attending: Hematology and Oncology

## 2022-07-25 ENCOUNTER — Other Ambulatory Visit: Payer: Self-pay

## 2022-07-25 VITALS — BP 150/69 | HR 107 | Temp 98.1°F | Wt 146.0 lb

## 2022-07-25 DIAGNOSIS — M8580 Other specified disorders of bone density and structure, unspecified site: Secondary | ICD-10-CM | POA: Diagnosis not present

## 2022-07-25 DIAGNOSIS — I10 Essential (primary) hypertension: Secondary | ICD-10-CM | POA: Insufficient documentation

## 2022-07-25 DIAGNOSIS — D500 Iron deficiency anemia secondary to blood loss (chronic): Secondary | ICD-10-CM | POA: Diagnosis not present

## 2022-07-25 DIAGNOSIS — Z79899 Other long term (current) drug therapy: Secondary | ICD-10-CM | POA: Diagnosis not present

## 2022-07-25 DIAGNOSIS — E119 Type 2 diabetes mellitus without complications: Secondary | ICD-10-CM | POA: Insufficient documentation

## 2022-07-25 DIAGNOSIS — Z7982 Long term (current) use of aspirin: Secondary | ICD-10-CM | POA: Insufficient documentation

## 2022-07-25 DIAGNOSIS — Z7984 Long term (current) use of oral hypoglycemic drugs: Secondary | ICD-10-CM | POA: Diagnosis not present

## 2022-07-25 DIAGNOSIS — D509 Iron deficiency anemia, unspecified: Secondary | ICD-10-CM | POA: Diagnosis not present

## 2022-07-25 LAB — CBC WITH DIFFERENTIAL (CANCER CENTER ONLY)
Abs Immature Granulocytes: 0.02 10*3/uL (ref 0.00–0.07)
Basophils Absolute: 0.1 10*3/uL (ref 0.0–0.1)
Basophils Relative: 1 %
Eosinophils Absolute: 0.3 10*3/uL (ref 0.0–0.5)
Eosinophils Relative: 4 %
HCT: 39.4 % (ref 36.0–46.0)
Hemoglobin: 13 g/dL (ref 12.0–15.0)
Immature Granulocytes: 0 %
Lymphocytes Relative: 22 %
Lymphs Abs: 1.8 10*3/uL (ref 0.7–4.0)
MCH: 26.5 pg (ref 26.0–34.0)
MCHC: 33 g/dL (ref 30.0–36.0)
MCV: 80.4 fL (ref 80.0–100.0)
Monocytes Absolute: 0.6 10*3/uL (ref 0.1–1.0)
Monocytes Relative: 8 %
Neutro Abs: 5.1 10*3/uL (ref 1.7–7.7)
Neutrophils Relative %: 65 %
Platelet Count: 377 10*3/uL (ref 150–400)
RBC: 4.9 MIL/uL (ref 3.87–5.11)
RDW: 15.1 % (ref 11.5–15.5)
WBC Count: 7.9 10*3/uL (ref 4.0–10.5)
nRBC: 0 % (ref 0.0–0.2)

## 2022-07-25 LAB — CMP (CANCER CENTER ONLY)
ALT: 23 U/L (ref 0–44)
AST: 20 U/L (ref 15–41)
Albumin: 4.5 g/dL (ref 3.5–5.0)
Alkaline Phosphatase: 68 U/L (ref 38–126)
Anion gap: 12 (ref 5–15)
BUN: 9 mg/dL (ref 8–23)
CO2: 24 mmol/L (ref 22–32)
Calcium: 10.2 mg/dL (ref 8.9–10.3)
Chloride: 101 mmol/L (ref 98–111)
Creatinine: 0.73 mg/dL (ref 0.44–1.00)
GFR, Estimated: >60 mL/min (ref >60)
Glucose, Bld: 161 mg/dL — ABNORMAL HIGH (ref 70–99)
Potassium: 4.3 mmol/L (ref 3.5–5.1)
Sodium: 137 mmol/L (ref 135–145)
Total Bilirubin: 0.3 mg/dL (ref 0.3–1.2)
Total Protein: 7.5 g/dL (ref 6.5–8.1)

## 2022-07-25 LAB — FERRITIN: Ferritin: 7 ng/mL — ABNORMAL LOW (ref 11–307)

## 2022-07-25 LAB — RETIC PANEL
Immature Retic Fract: 6.9 % (ref 2.3–15.9)
RBC.: 4.93 MIL/uL (ref 3.87–5.11)
Retic Count, Absolute: 50.3 10*3/uL (ref 19.0–186.0)
Retic Ct Pct: 1 % (ref 0.4–3.1)
Reticulocyte Hemoglobin: 30.2 pg (ref >27.9)

## 2022-07-25 LAB — IRON AND IRON BINDING CAPACITY (CC-WL,HP ONLY)
Iron: 28 ug/dL (ref 28–170)
Saturation Ratios: 6 % — ABNORMAL LOW (ref 10.4–31.8)
TIBC: 480 ug/dL — ABNORMAL HIGH (ref 250–450)
UIBC: 452 ug/dL — ABNORMAL HIGH (ref 148–442)

## 2022-07-25 NOTE — Progress Notes (Unsigned)
[place] Telephone:(336) [phone]   Fax:([phone]  PROGRESS NOTE  Patient Care Team: Berkley Harvey, NP as PCP - General (Nurse Practitioner) Leonie Man, MD as PCP - Cardiology (Cardiology)  Hematological/Oncological History # Iron Deficiency Anemia of Unclear Etiology  01/24/2022: Ferritin 3.0, Sat 4.0%, TIBC 496, Hgb 10.5, MCV 73.7, WBC 7.2, Plt 363 04/11/2022: Establish care with Dr. Lorenso Courier 07/25/2022: white blood cell count 7.9, hemoglobin 13.0, MCV 80.4, and platelets of 377  Interval History:  Natasha Henry 71 y.o. female with medical history significant for iron deficiency anemia of unclear etiology who presents for a follow up visit. The patient's last visit was on 04/11/2022 at which time she established care. In the interim since the last visit she has continued on p.o. iron therapy.  On exam today Natasha Henry reports that she has been faithfully taking her p.o. iron in the morning with orange juice.  She was told that due to her type 2 diabetes she could transition to vitamin C tablets instead.  She notes that she also started taking the medications in the afternoon.  She notes that since she started taking the iron pill she has not noticed much of a difference.  She notes that however when she works in the yard she reports that overall she feels good and does not have to take frequent breaks.  She notes that she tries to "listen to her body" and take it easy when she needs to.  She underwent stool testing looking for iron loss in the stool but this was negative.  She otherwise denies any fevers, chills, sweats, nausea, vomiting or diarrhea.  A full 10 point ROS was otherwise negative.  MEDICAL HISTORY:  Past Medical History:  Diagnosis Date   Cataracts, bilateral    Diabetes mellitus without complication ([place])    Hypercholesterolemia    Hypertension    Osteopenia     SURGICAL HISTORY: Past Surgical History:  Procedure Laterality Date    OOPHORECTOMY      SOCIAL HISTORY: Social History   Socioeconomic History   Marital status: Widowed    Spouse name: Not on file   Number of children: 2   Years of education: Not on file   Highest education level: Not on file  Occupational History    Employer: [employer]  Tobacco Use   Smoking status: Never   Smokeless tobacco: Not on file  Substance and Sexual Activity   Alcohol use: No   Drug use: Never   Sexual activity: Yes    Partners: Male  Other Topics Concern   Not on file  Social History Narrative   She is a married mother of 2 (daughter-39 and son-42) as of June 2023   She had 2 grandchildren from her daughter   She is relatively active at work, and also does enjoy gardening and walking.   Social Determinants of Health   Financial Resource Strain: Not on file  Food Insecurity: Not on file  Transportation Needs: Not on file  Physical Activity: Not on file  Stress: Not on file  Social Connections: Not on file  Intimate Partner Violence: Not on file    FAMILY HISTORY: Family History  Problem Relation Age of Onset   Hypertension Mother    Diabetes Mellitus II Mother    Lung cancer Mother    Hypertension Father    Heart attack Father 64       Long-term smoker   Coronary artery disease Father  70   Diabetes Mellitus II Father    Rheumatic fever Maternal Grandmother    Valvular heart disease Maternal Grandmother        History of AVR for rheumatic heart disease/ALS    ALLERGIES:  is allergic to other, sulfa antibiotics, and penicillins.  MEDICATIONS:  Current Outpatient Medications  Medication Sig Dispense Refill   albuterol (VENTOLIN HFA) 108 (90 Base) MCG/ACT inhaler Inhale 2 puffs into the lungs every 6 (six) hours as needed.     amLODipine (NORVASC) 5 MG tablet Take 1 tablet by mouth daily.     aspirin EC 81 MG tablet Take 1 tablet by mouth daily.     Calcium Carbonate-Vitamin D 600-3.125 MG-MCG TABS Take by mouth.      Chromium-Cinnamon (CINNAMON PLUS CHROMIUM) [phone] MCG-MG CAPS Take by mouth.     citalopram (CELEXA) 20 MG tablet Take 1 1/2 tablets daily     Cranberry 500 MG CAPS Take by mouth.     cyclobenzaprine (FLEXERIL) 10 MG tablet Take 1 tablet (10 mg total) by mouth 2 (two) times daily as needed for muscle spasms. 20 tablet 0   ezetimibe (ZETIA) 10 MG tablet TAKE 1 TABLET(10 MG) BY MOUTH DAILY     ferrous sulfate 325 (65 FE) MG tablet Take 1 tablet (325 mg total) by mouth daily with breakfast. Please take with a source of Vitamin C 90 tablet 3   Garlic [id number] MG TBEC Take by mouth.     Ginkgo Biloba 40 MG TABS Take by mouth.     hydrocortisone 2.5 % cream APPLY THIN LAYER TOPICALLY TO THE AFFECTED AREA 2 TO 4 TIMES DAILY     LORazepam (ATIVAN) 1 MG tablet TAKE 1/2 TABLET(0.5 MG) BY MOUTH DAILY AS NEEDED     Magnesium 500 MG CAPS Take by mouth.     Misc Natural Products (YUMVS BEET ROOT-TART CHERRY) 250-0.5 MG CHEW Chew by mouth.     omeprazole (PRILOSEC) 40 MG capsule Take 40 mg by mouth daily.     ramipril (ALTACE) 10 MG capsule Take 10 mg by mouth 2 (two) times daily.     SitaGLIPtin-MetFORMIN HCl (JANUMET XR) 50-1000 MG TB24 Take 2 tablets by mouth daily.     Turmeric (QC TUMERIC COMPLEX) 500 MG CAPS Take by mouth.     No current facility-administered medications for this visit.    REVIEW OF SYSTEMS:   Constitutional: ( - ) fevers, ( - )  chills , ( - ) night sweats Eyes: ( - ) blurriness of vision, ( - ) double vision, ( - ) watery eyes Ears, nose, mouth, throat, and face: ( - ) mucositis, ( - ) sore throat Respiratory: ( - ) cough, ( - ) dyspnea, ( - ) wheezes Cardiovascular: ( - ) palpitation, ( - ) chest discomfort, ( - ) lower extremity swelling Gastrointestinal:  ( - ) nausea, ( - ) heartburn, ( - ) change in bowel habits Skin: ( - ) abnormal skin rashes Lymphatics: ( - ) new lymphadenopathy, ( - ) easy bruising Neurological: ( - ) numbness, ( - ) tingling, ( - ) new  weaknesses Behavioral/Psych: ( - ) mood change, ( - ) new changes  All other systems were reviewed with the patient and are negative.  PHYSICAL EXAMINATION:  Vitals:   07/25/22 1040  BP: (!) 150/69  Pulse: (!) 107  Temp: 98.1 F (36.7 C)  SpO2: 99%   Filed Weights   07/25/22 1040  Weight: 146 lb (  66.2 kg)    GENERAL: Well-appearing elderly Caucasian female, alert, no distress and comfortable SKIN: skin color, texture, turgor are normal, no rashes or significant lesions EYES: conjunctiva are pink and non-injected, sclera clear LUNGS: clear to auscultation and percussion with normal breathing effort HEART: regular rate & rhythm and no murmurs and no lower extremity edema Musculoskeletal: no cyanosis of digits and no clubbing  PSYCH: alert & oriented x 3, fluent speech NEURO: no focal motor/sensory deficits  LABORATORY DATA:  I have reviewed the data as listed    Latest Ref Rng & Units 07/25/2022   10:04 AM 04/11/2022    1:56 PM  CBC  WBC 4.0 - 10.5 K/uL 7.9  8.3   Hemoglobin 12.0 - 15.0 g/dL 13.0  10.5   Hematocrit 36.0 - 46.0 % 39.4  34.3   Platelets 150 - 400 K/uL 377  442        Latest Ref Rng & Units 07/25/2022   10:04 AM 04/11/2022    1:56 PM  CMP  Glucose 70 - 99 mg/dL 161  99    100   BUN 8 - 23 mg/dL 9  12    11    Creatinine 0.44 - 1.00 mg/dL 0.73  0.68    0.70   Sodium 135 - 145 mmol/L 137  138    138   Potassium 3.5 - 5.1 mmol/L 4.3  4.4    4.3   Chloride 98 - 111 mmol/L 101  103    103   CO2 22 - 32 mmol/L 24  27    28    Calcium 8.9 - 10.3 mg/dL 10.2  9.6    10.0   Total Protein 6.5 - 8.1 g/dL 7.5  7.6   Total Bilirubin 0.3 - 1.2 mg/dL 0.3  0.3   Alkaline Phos 38 - 126 U/L 68  56   AST 15 - 41 U/L 20  17   ALT 0 - 44 U/L 23  17     RADIOGRAPHIC STUDIES: No results found.  ASSESSMENT & PLAN Natasha Henry 72 y.o. female with medical history significant for iron deficiency anemia of unclear etiology who presents for a follow up visit.    After review of the labs, review of the records, and discussion with the patient the patients findings are most consistent with iron deficiency anemia of unclear etiology.  At this time the differential includes GI bleed versus poor p.o. dietary intake.  The patient does believe that she does not intake enough iron rich foods as she does not eat a lot of meat.  She has fecal occult blood testing yearly as she had a bad experience with colonoscopy 13 to 14 years ago.  She is currently due for this testing.  If positive would recommend EGD and colonoscopy for further evaluation.  If negative can continue with monitoring with p.o. iron therapy.  The patient voiced understanding of the plan moving forward.   # Iron Deficiency Anemia 2/2 to Unclear Etiology -- Findings are consistent with iron deficiency anemia secondary an unclear etiology.  --patient has undergone testing with FOB cards, no evidence of GI bleeding. --continue ferrous sulfate 325 mg daily with a source of vitamin C --Labs today show white blood cell count 7.9, hemoglobin 13.0, MCV 80.4, and platelets of 377 --Plan for return to clinic in 6 months time to reassess.   No orders of the defined types were placed in this encounter.   All questions were answered. The patient  knows to call the clinic with any problems, questions or concerns.  A total of more than 30 minutes were spent on this encounter with face-to-face time and non-face-to-face time, including preparing to see the patient, ordering tests and/or medications, counseling the patient and coordination of care as outlined above.   Ledell Peoples, MD Department of Hematology/Oncology [place] at [facility] Phone: [phone] Pager: [phone] Email: [email or web address]  07/25/2022 11:14 AM

## 2023-01-28 ENCOUNTER — Inpatient Hospital Stay: Payer: Medicare Other | Attending: Hematology and Oncology

## 2023-01-28 ENCOUNTER — Other Ambulatory Visit: Payer: Self-pay | Admitting: *Deleted

## 2023-01-28 ENCOUNTER — Inpatient Hospital Stay (HOSPITAL_BASED_OUTPATIENT_CLINIC_OR_DEPARTMENT_OTHER): Payer: Medicare Other | Admitting: Hematology and Oncology

## 2023-01-28 VITALS — BP 129/67 | HR 90 | Temp 97.8°F | Resp 17 | Wt 143.2 lb

## 2023-01-28 DIAGNOSIS — D509 Iron deficiency anemia, unspecified: Secondary | ICD-10-CM | POA: Insufficient documentation

## 2023-01-28 DIAGNOSIS — D500 Iron deficiency anemia secondary to blood loss (chronic): Secondary | ICD-10-CM

## 2023-01-28 LAB — CBC WITH DIFFERENTIAL (CANCER CENTER ONLY)
Abs Immature Granulocytes: 0.05 10*3/uL (ref 0.00–0.07)
Basophils Absolute: 0.1 10*3/uL (ref 0.0–0.1)
Basophils Relative: 1 %
Eosinophils Absolute: 0.4 10*3/uL (ref 0.0–0.5)
Eosinophils Relative: 5 %
HCT: 36.9 % (ref 36.0–46.0)
Hemoglobin: 11.7 g/dL — ABNORMAL LOW (ref 12.0–15.0)
Immature Granulocytes: 1 %
Lymphocytes Relative: 36 %
Lymphs Abs: 3 10*3/uL (ref 0.7–4.0)
MCH: 25.3 pg — ABNORMAL LOW (ref 26.0–34.0)
MCHC: 31.7 g/dL (ref 30.0–36.0)
MCV: 79.9 fL — ABNORMAL LOW (ref 80.0–100.0)
Monocytes Absolute: 0.5 10*3/uL (ref 0.1–1.0)
Monocytes Relative: 7 %
Neutro Abs: 4.2 10*3/uL (ref 1.7–7.7)
Neutrophils Relative %: 50 %
Platelet Count: 396 10*3/uL (ref 150–400)
RBC: 4.62 MIL/uL (ref 3.87–5.11)
RDW: 13.8 % (ref 11.5–15.5)
WBC Count: 8.3 10*3/uL (ref 4.0–10.5)
nRBC: 0 % (ref 0.0–0.2)

## 2023-01-28 LAB — CMP (CANCER CENTER ONLY)
ALT: 20 U/L (ref 0–44)
AST: 18 U/L (ref 15–41)
Albumin: 4.3 g/dL (ref 3.5–5.0)
Alkaline Phosphatase: 59 U/L (ref 38–126)
Anion gap: 6 (ref 5–15)
BUN: 11 mg/dL (ref 8–23)
CO2: 27 mmol/L (ref 22–32)
Calcium: 9.4 mg/dL (ref 8.9–10.3)
Chloride: 104 mmol/L (ref 98–111)
Creatinine: 0.82 mg/dL (ref 0.44–1.00)
GFR, Estimated: >60 mL/min (ref >60)
Glucose, Bld: 141 mg/dL — ABNORMAL HIGH (ref 70–99)
Potassium: 4.2 mmol/L (ref 3.5–5.1)
Sodium: 137 mmol/L (ref 135–145)
Total Bilirubin: 0.3 mg/dL (ref 0.3–1.2)
Total Protein: 7.1 g/dL (ref 6.5–8.1)

## 2023-01-28 LAB — IRON AND IRON BINDING CAPACITY (CC-WL,HP ONLY)
Iron: 34 ug/dL (ref 28–170)
Saturation Ratios: 7 % — ABNORMAL LOW (ref 10.4–31.8)
TIBC: 511 ug/dL — ABNORMAL HIGH (ref 250–450)
UIBC: 477 ug/dL — ABNORMAL HIGH (ref 148–442)

## 2023-01-28 LAB — RETIC PANEL
Immature Retic Fract: 17.8 % — ABNORMAL HIGH (ref 2.3–15.9)
RBC.: 4.57 MIL/uL (ref 3.87–5.11)
Retic Count, Absolute: 54.8 10*3/uL (ref 19.0–186.0)
Retic Ct Pct: 1.2 % (ref 0.4–3.1)
Reticulocyte Hemoglobin: 31.2 pg (ref >27.9)

## 2023-01-28 LAB — FERRITIN: Ferritin: 5 ng/mL — ABNORMAL LOW (ref 11–307)

## 2023-01-28 NOTE — Progress Notes (Signed)
[facility] [phone]   [phone]  PROGRESS NOTE  Patient Care Team: Iona Hansen, NP as PCP - General (Nurse Practitioner) Marykay Lex, MD as PCP - Cardiology (Cardiology)  Hematological/Oncological History # Iron Deficiency Anemia of Unclear Etiology  01/24/2022: Ferritin 3.0, Sat 4.0%, TIBC 496, Hgb 10.5, MCV 73.7, WBC 7.2, Plt 363 04/11/2022: Establish care with Dr. Leonides Schanz 07/25/2022: white blood cell count 7.9, hemoglobin 13.0, MCV 80.4, and platelets of 377  Interval History:  Natasha Henry 72 y.o. female with medical history significant for iron deficiency anemia of unclear etiology who presents for a follow up visit. The patient's last visit was on 07/25/2022. In the interim since the last visit she has continued on p.o. iron therapy.  On exam today Natasha Henry reports she has been okay overall interim since her last visit 6 months ago.  She reports that she has been going through a lot as she has been driving back and forth to [place] to help her brother who has pharyngeal cancer.  She notes that he has a G-tube.  She notes that she has not been taking iron pills as they are causing reflux and she has difficulty tolerating them.  She also reports that she has been tired but is not having any lightheadedness, dizziness, or shortness of breath.  She has had no recent infectious symptoms with runny nose, sore throat, or cough.   She otherwise denies any fevers, chills, sweats, nausea, vomiting or diarrhea.  A full 10 point ROS was otherwise negative.  MEDICAL HISTORY:  Past Medical History:  Diagnosis Date   Cataracts, bilateral    Diabetes mellitus without complication (HCC)    Hypercholesterolemia    Hypertension    Osteopenia     SURGICAL HISTORY: Past Surgical History:  Procedure Laterality Date   OOPHORECTOMY      SOCIAL HISTORY: Social History   Socioeconomic History   Marital status: Widowed    Spouse name: Not  on file   Number of children: 2   Years of education: Not on file   Highest education level: Not on file  Occupational History    Employer: [employer]  Tobacco Use   Smoking status: Never   Smokeless tobacco: Not on file  Substance and Sexual Activity   Alcohol use: No   Drug use: Never   Sexual activity: Yes    Partners: Male  Other Topics Concern   Not on file  Social History Narrative   She is a married mother of 2 (daughter-39 and son-42) as of June 2023   She had 2 grandchildren from her daughter   She is relatively active at work, and also does enjoy gardening and walking.   Social Determinants of Health   Financial Resource Strain: Not on file  Food Insecurity: Not on file  Transportation Needs: Not on file  Physical Activity: Not on file  Stress: Not on file  Social Connections: Not on file  Intimate Partner Violence: Not on file    FAMILY HISTORY: Family History  Problem Relation Age of Onset   Hypertension Mother    Diabetes Mellitus II Mother    Lung cancer Mother    Hypertension Father    Heart attack Father 65       Long-term smoker   Coronary artery disease Father 60   Diabetes Mellitus II Father    Rheumatic fever Maternal Grandmother    Valvular heart disease Maternal Grandmother  History of AVR for rheumatic heart disease/ALS    ALLERGIES:  is allergic to other, sulfa antibiotics, and penicillins.  MEDICATIONS:  Current Outpatient Medications  Medication Sig Dispense Refill   albuterol (VENTOLIN HFA) 108 (90 Base) MCG/ACT inhaler Inhale 2 puffs into the lungs every 6 (six) hours as needed.     amLODipine (NORVASC) 5 MG tablet Take 1 tablet by mouth daily.     aspirin EC 81 MG tablet Take 1 tablet by mouth daily.     Calcium Carbonate-Vitamin D 600-3.125 MG-MCG TABS Take by mouth.     Chromium-Cinnamon (CINNAMON PLUS CHROMIUM) [phone] MCG-MG CAPS Take by mouth.     citalopram (CELEXA) 20 MG tablet Take 1 1/2 tablets daily      Cranberry 500 MG CAPS Take by mouth.     cyclobenzaprine (FLEXERIL) 10 MG tablet Take 1 tablet (10 mg total) by mouth 2 (two) times daily as needed for muscle spasms. 20 tablet 0   ezetimibe (ZETIA) 10 MG tablet TAKE 1 TABLET(10 MG) BY MOUTH DAILY     ferrous sulfate 325 (65 FE) MG tablet Take 1 tablet (325 mg total) by mouth daily with breakfast. Please take with a source of Vitamin C 90 tablet 3   Garlic 2000 MG TBEC Take by mouth.     Ginkgo Biloba 40 MG TABS Take by mouth.     hydrocortisone 2.5 % cream APPLY THIN LAYER TOPICALLY TO THE AFFECTED AREA 2 TO 4 TIMES DAILY     LORazepam (ATIVAN) 1 MG tablet TAKE 1/2 TABLET(0.5 MG) BY MOUTH DAILY AS NEEDED     Magnesium 500 MG CAPS Take by mouth.     Misc Natural Products (YUMVS BEET ROOT-TART CHERRY) 250-0.5 MG CHEW Chew by mouth.     omeprazole (PRILOSEC) 40 MG capsule Take 40 mg by mouth daily.     ramipril (ALTACE) 10 MG capsule Take 10 mg by mouth 2 (two) times daily.     SitaGLIPtin-MetFORMIN HCl (JANUMET XR) 50-1000 MG TB24 Take 2 tablets by mouth daily.     Turmeric (QC TUMERIC COMPLEX) 500 MG CAPS Take by mouth.     No current facility-administered medications for this visit.    REVIEW OF SYSTEMS:   Constitutional: ( - ) fevers, ( - )  chills , ( - ) night sweats Eyes: ( - ) blurriness of vision, ( - ) double vision, ( - ) watery eyes Ears, nose, mouth, throat, and face: ( - ) mucositis, ( - ) sore throat Respiratory: ( - ) cough, ( - ) dyspnea, ( - ) wheezes Cardiovascular: ( - ) palpitation, ( - ) chest discomfort, ( - ) lower extremity swelling Gastrointestinal:  ( - ) nausea, ( - ) heartburn, ( - ) change in bowel habits Skin: ( - ) abnormal skin rashes Lymphatics: ( - ) new lymphadenopathy, ( - ) easy bruising Neurological: ( - ) numbness, ( - ) tingling, ( - ) new weaknesses Behavioral/Psych: ( - ) mood change, ( - ) new changes  All other systems were reviewed with the patient and are negative.  PHYSICAL  EXAMINATION:  Vitals:   01/28/23 1443  BP: 129/67  Pulse: 90  Resp: 17  Temp: 97.8 F (36.6 C)  SpO2: 99%   Filed Weights   01/28/23 1443  Weight: 143 lb 3.2 oz (65 kg)    GENERAL: Well-appearing elderly Caucasian female, alert, no distress and comfortable SKIN: skin color, texture, turgor are normal, no rashes or significant  lesions EYES: conjunctiva are pink and non-injected, sclera clear LUNGS: clear to auscultation and percussion with normal breathing effort HEART: regular rate & rhythm and no murmurs and no lower extremity edema Musculoskeletal: no cyanosis of digits and no clubbing  PSYCH: alert & oriented x 3, fluent speech NEURO: no focal motor/sensory deficits  LABORATORY DATA:  I have reviewed the data as listed    Latest Ref Rng & Units 01/28/2023    2:19 PM 07/25/2022   10:04 AM 04/11/2022    1:56 PM  CBC  WBC 4.0 - 10.5 K/uL 8.3  7.9  8.3   Hemoglobin 12.0 - 15.0 g/dL 40.9  81.1  91.4   Hematocrit 36.0 - 46.0 % 36.9  39.4  34.3   Platelets 150 - 400 K/uL 396  377  442        Latest Ref Rng & Units 01/28/2023    2:19 PM 07/25/2022   10:04 AM 04/11/2022    1:56 PM  CMP  Glucose 70 - 99 mg/dL 782  956  99    213   BUN 8 - 23 mg/dL 11  9  12    11    Creatinine 0.44 - 1.00 mg/dL 0.86  5.78  4.69    6.29   Sodium 135 - 145 mmol/L 137  137  138    138   Potassium 3.5 - 5.1 mmol/L 4.2  4.3  4.4    4.3   Chloride 98 - 111 mmol/L 104  101  103    103   CO2 22 - 32 mmol/L 27  24  27    28    Calcium 8.9 - 10.3 mg/dL 9.4  52.8  9.6    41.3   Total Protein 6.5 - 8.1 g/dL 7.1  7.5  7.6   Total Bilirubin 0.3 - 1.2 mg/dL 0.3  0.3  0.3   Alkaline Phos 38 - 126 U/L 59  68  56   AST 15 - 41 U/L 18  20  17    ALT 0 - 44 U/L 20  23  17      RADIOGRAPHIC STUDIES: No results found.  ASSESSMENT & PLAN Natasha Henry 72 y.o. female with medical history significant for iron deficiency anemia of unclear etiology who presents for a follow up visit.   After  review of the labs, review of the records, and discussion with the patient the patients findings are most consistent with iron deficiency anemia of unclear etiology.  At this time the differential includes GI bleed versus poor p.o. dietary intake.  The patient does believe that she does not intake enough iron rich foods as she does not eat a lot of meat.  She has fecal occult blood testing yearly as she had a bad experience with colonoscopy 13 to 14 years ago.  She is currently due for this testing.  If positive would recommend EGD and colonoscopy for further evaluation.  If negative can continue with monitoring with p.o. iron therapy.  The patient voiced understanding of the plan moving forward.   # Iron Deficiency Anemia 2/2 to Unclear Etiology -- Findings are consistent with iron deficiency anemia secondary an unclear etiology.  --patient has undergone testing with FOB cards, no evidence of GI bleeding. --continue ferrous sulfate 325 mg daily with a source of vitamin C --Labs today show white blood cell count 8.3, hemoglobin 11.7, MCV 79.9, and platelets of 396.  Ferritin was 5, iron sat 7%, with total iron binding capacity  of 511 -- plan to pursue another round of IV iron therapy.  --Plan for return to clinic in 4 to 6 weeks after last IV iron.  No orders of the defined types were placed in this encounter.   All questions were answered. The patient knows to call the clinic with any problems, questions or concerns.  A total of more than 30 minutes were spent on this encounter with face-to-face time and non-face-to-face time, including preparing to see the patient, ordering tests and/or medications, counseling the patient and coordination of care as outlined above.   Ulysees Barns, MD Department of Hematology/Oncology [facility] Phone: [phone] Pager: [phone] Email: [email or web address]  01/31/2023 10:58 AM

## 2023-01-31 ENCOUNTER — Encounter: Payer: Self-pay | Admitting: Hematology and Oncology

## 2023-01-31 DIAGNOSIS — D500 Iron deficiency anemia secondary to blood loss (chronic): Secondary | ICD-10-CM | POA: Insufficient documentation

## 2023-02-02 ENCOUNTER — Other Ambulatory Visit: Payer: Self-pay | Admitting: Pharmacy Technician

## 2023-02-02 ENCOUNTER — Telehealth: Payer: Self-pay | Admitting: Pharmacy Technician

## 2023-02-02 NOTE — Telephone Encounter (Signed)
Dr. Leonides Schanz,  Monoferric is non preferred and will be denied if patient has not failed preferred medication. Preferred medication is Feraheme. Would you like to use Feraheme?  Auth Submission: NO AUTH NEEDED Site of care: Site of care: [facility] Payer: BCBC MEDICARE Medication & CPT/J Code(s) submitted: Feraheme (ferumoxytol) [id number] Route of submission (phone, fax, portal):  Phone # Fax # Auth type: Buy/Bill PB Units/visits requested: X2 Reference number:  Approval from: 02/02/23 to 04/04/23

## 2023-02-02 NOTE — Telephone Encounter (Signed)
Dr. Leonides Schanz, Thank you, will do.

## 2023-02-04 ENCOUNTER — Telehealth: Payer: Self-pay | Admitting: *Deleted

## 2023-02-04 ENCOUNTER — Other Ambulatory Visit: Payer: Self-pay | Admitting: *Deleted

## 2023-02-04 ENCOUNTER — Telehealth: Payer: Self-pay | Admitting: Hematology and Oncology

## 2023-02-04 MED ORDER — FERROUS SULFATE 325 (65 FE) MG PO TABS: 325.0000 mg | ORAL_TABLET | Freq: Every day | ORAL | 3 refills | Status: AC

## 2023-02-04 NOTE — Telephone Encounter (Signed)
Received vm message from pt regarding her IV iron infusions. She requested a call back. TCT patient and spoke with her. She states she is not sure she will be able to do the IV iron as she has to travel to [place] to take care of her brother for a period of time. Advised that she really should get the IV iron before she leaves so that she has the energy to take care of her brother. Advised that it is only 2 doses, a week apart. If she can get one dose this week and one dose next week then she will be done. Pt agreed that she should do this in order to have the stamina to take care of her brother. She states she will call back to the [facility] to get those appts scheduled.    Scheduling message sent for f/u appts with Dr. Leonides Schanz in November.

## 2023-02-05 ENCOUNTER — Ambulatory Visit (INDEPENDENT_AMBULATORY_CARE_PROVIDER_SITE_OTHER): Payer: Medicare Other

## 2023-02-05 VITALS — BP 125/70 | HR 85 | Temp 98.0°F | Resp 16 | Ht 61.0 in | Wt 145.0 lb

## 2023-02-05 DIAGNOSIS — D509 Iron deficiency anemia, unspecified: Secondary | ICD-10-CM | POA: Diagnosis not present

## 2023-02-05 DIAGNOSIS — D500 Iron deficiency anemia secondary to blood loss (chronic): Secondary | ICD-10-CM

## 2023-02-05 MED ORDER — SODIUM CHLORIDE 0.9 % IV SOLN
510.0000 mg | Freq: Once | INTRAVENOUS | Status: AC
  Administered 2023-02-05: 510 mg via INTRAVENOUS
  Filled 2023-02-05: qty 17

## 2023-02-05 MED ORDER — DIPHENHYDRAMINE HCL 25 MG PO CAPS
25.0000 mg | ORAL_CAPSULE | Freq: Once | ORAL | Status: DC
  Filled 2023-02-05: qty 1

## 2023-02-05 MED ORDER — ACETAMINOPHEN 325 MG PO TABS
650.0000 mg | ORAL_TABLET | Freq: Once | ORAL | Status: DC
  Filled 2023-02-05: qty 2

## 2023-02-05 NOTE — Progress Notes (Signed)
Diagnosis: Iron Deficiency Anemia  Provider:  Chilton Greathouse MD  Procedure: IV Infusion  IV Type: Peripheral, IV Location: L Antecubital  Feraheme (Ferumoxytol), Dose: 510 mg  Infusion Start Time: 1105  Infusion Stop Time: 1125  Post Infusion IV Care: Observation period completed and Peripheral IV Discontinued  Discharge: Condition: Good, Destination: Home . AVS Provided  Performed by:  Loney Hering, LPN

## 2023-02-12 ENCOUNTER — Ambulatory Visit: Payer: Medicare Other

## 2023-02-12 VITALS — BP 124/71 | HR 79 | Temp 97.7°F | Resp 18 | Ht 61.0 in | Wt 141.6 lb

## 2023-02-12 DIAGNOSIS — D509 Iron deficiency anemia, unspecified: Secondary | ICD-10-CM | POA: Diagnosis not present

## 2023-02-12 DIAGNOSIS — D500 Iron deficiency anemia secondary to blood loss (chronic): Secondary | ICD-10-CM

## 2023-02-12 MED ORDER — DIPHENHYDRAMINE HCL 25 MG PO CAPS: 25.0000 mg | ORAL_CAPSULE | Freq: Once | ORAL | Status: DC

## 2023-02-12 MED ORDER — SODIUM CHLORIDE 0.9 % IV SOLN
510.0000 mg | Freq: Once | INTRAVENOUS | Status: AC
  Administered 2023-02-12: 510 mg via INTRAVENOUS
  Filled 2023-02-12: qty 17

## 2023-02-12 MED ORDER — ACETAMINOPHEN 325 MG PO TABS: 650.0000 mg | ORAL_TABLET | Freq: Once | ORAL | Status: DC

## 2023-02-12 NOTE — Progress Notes (Signed)
Diagnosis: Iron Deficiency Anemia  Provider:  Chilton Greathouse MD  Procedure: IV Infusion  IV Type: Peripheral, IV Location: L Forearm  Feraheme (Ferumoxytol), Dose: 510 mg  Infusion Start Time: 1327  Infusion Stop Time: 1345  Post Infusion IV Care: Patient declined observation and Peripheral IV Discontinued  Discharge: Condition: Good, Destination: Home . AVS Declined  Performed by:  Adriana Mccallum, RN

## 2023-03-12 ENCOUNTER — Other Ambulatory Visit: Payer: Self-pay | Admitting: Hematology and Oncology

## 2023-03-12 DIAGNOSIS — D500 Iron deficiency anemia secondary to blood loss (chronic): Secondary | ICD-10-CM

## 2023-03-13 ENCOUNTER — Inpatient Hospital Stay: Payer: Medicare Other | Attending: Hematology and Oncology

## 2023-03-13 DIAGNOSIS — D509 Iron deficiency anemia, unspecified: Secondary | ICD-10-CM | POA: Diagnosis present

## 2023-03-13 DIAGNOSIS — D500 Iron deficiency anemia secondary to blood loss (chronic): Secondary | ICD-10-CM

## 2023-03-13 LAB — CBC WITH DIFFERENTIAL (CANCER CENTER ONLY)
Abs Immature Granulocytes: 0.03 10*3/uL (ref 0.00–0.07)
Basophils Absolute: 0.1 10*3/uL (ref 0.0–0.1)
Basophils Relative: 2 %
Eosinophils Absolute: 0.4 10*3/uL (ref 0.0–0.5)
Eosinophils Relative: 4 %
HCT: 39.1 % (ref 36.0–46.0)
Hemoglobin: 12.9 g/dL (ref 12.0–15.0)
Immature Granulocytes: 0 %
Lymphocytes Relative: 31 %
Lymphs Abs: 2.8 10*3/uL (ref 0.7–4.0)
MCH: 27.9 pg (ref 26.0–34.0)
MCHC: 33 g/dL (ref 30.0–36.0)
MCV: 84.4 fL (ref 80.0–100.0)
Monocytes Absolute: 0.5 10*3/uL (ref 0.1–1.0)
Monocytes Relative: 6 %
Neutro Abs: 5.1 10*3/uL (ref 1.7–7.7)
Neutrophils Relative %: 57 %
Platelet Count: 359 10*3/uL (ref 150–400)
RBC: 4.63 MIL/uL (ref 3.87–5.11)
RDW: 17 % — ABNORMAL HIGH (ref 11.5–15.5)
WBC Count: 8.9 10*3/uL (ref 4.0–10.5)
nRBC: 0 % (ref 0.0–0.2)

## 2023-03-13 LAB — FERRITIN: Ferritin: 219 ng/mL (ref 11–307)

## 2023-03-13 LAB — CMP (CANCER CENTER ONLY)
ALT: 20 U/L (ref 0–44)
AST: 17 U/L (ref 15–41)
Albumin: 4.4 g/dL (ref 3.5–5.0)
Alkaline Phosphatase: 52 U/L (ref 38–126)
Anion gap: 6 (ref 5–15)
BUN: 9 mg/dL (ref 8–23)
CO2: 27 mmol/L (ref 22–32)
Calcium: 9.3 mg/dL (ref 8.9–10.3)
Chloride: 105 mmol/L (ref 98–111)
Creatinine: 0.69 mg/dL (ref 0.44–1.00)
GFR, Estimated: >60 mL/min (ref >60)
Glucose, Bld: 100 mg/dL — ABNORMAL HIGH (ref 70–99)
Potassium: 4.3 mmol/L (ref 3.5–5.1)
Sodium: 138 mmol/L (ref 135–145)
Total Bilirubin: 0.3 mg/dL (ref 0.3–1.2)
Total Protein: 6.9 g/dL (ref 6.5–8.1)

## 2023-03-13 LAB — RETIC PANEL
Immature Retic Fract: 8.5 % (ref 2.3–15.9)
RBC.: 4.55 MIL/uL (ref 3.87–5.11)
Retic Count, Absolute: 84.2 10*3/uL (ref 19.0–186.0)
Retic Ct Pct: 1.9 % (ref 0.4–3.1)
Reticulocyte Hemoglobin: 34 pg (ref >27.9)

## 2023-03-13 LAB — IRON AND IRON BINDING CAPACITY (CC-WL,HP ONLY)
Iron: 69 ug/dL (ref 28–170)
Saturation Ratios: 23 % (ref 10.4–31.8)
TIBC: 307 ug/dL (ref 250–450)
UIBC: 238 ug/dL (ref 148–442)

## 2023-03-20 ENCOUNTER — Inpatient Hospital Stay (HOSPITAL_BASED_OUTPATIENT_CLINIC_OR_DEPARTMENT_OTHER): Payer: Medicare Other | Admitting: Hematology and Oncology

## 2023-03-20 VITALS — BP 148/77 | HR 91 | Temp 98.5°F | Resp 16 | Wt 143.5 lb

## 2023-03-20 DIAGNOSIS — D509 Iron deficiency anemia, unspecified: Secondary | ICD-10-CM | POA: Diagnosis not present

## 2023-03-20 DIAGNOSIS — D500 Iron deficiency anemia secondary to blood loss (chronic): Secondary | ICD-10-CM | POA: Diagnosis not present

## 2023-03-20 NOTE — Progress Notes (Signed)
[facility] [phone]   [phone]  PROGRESS NOTE  Patient Care Team: Iona Hansen, NP as PCP - General (Nurse Practitioner) Marykay Lex, MD as PCP - Cardiology (Cardiology)  Hematological/Oncological History # Iron Deficiency Anemia of Unclear Etiology  01/24/2022: Ferritin 3.0, Sat 4.0%, TIBC 496, Hgb 10.5, MCV 73.7, WBC 7.2, Plt 363 04/11/2022: Establish care with Dr. Leonides Schanz 07/25/2022: white blood cell count 7.9, hemoglobin 13.0, MCV 80.4, and platelets of 377 02/05/2023-02/12/2023: feraheme 510 mg x 2 doses  03/13/2023: labs show white blood cell count 8.9, hemoglobin 12.9, MCV 84.4, platelets of 359. ferritin was 219  Interval History:  Natasha Henry 72 y.o. female with medical history significant for iron deficiency anemia of unclear etiology who presents for a follow up visit. The patient's last visit was on 01/28/2023. In the interim since the last visit she has continued on p.o. iron therapy.  On exam today Ms. Birks reports she tolerated her 2 iron infusions well without any difficulty.  She reports that she did not notice any difference after receiving the infusions.  She still gets tired.  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  She recently planted 3 trees by herself.  She notes that she is doing her best to try to drink plenty of fluids.  She notes he is not having any lightheadedness, dizziness, shortness of breath.  She reports her energy today is about a 7 out of 10.  Her appetite does feel improved.  She reports that she has not been having any issues with bleeding, bruising, or dark stools.  Overall she feels that her health is steady and has no questions concerns or complaints today.   She otherwise denies any fevers, chills, sweats, nausea, vomiting or diarrhea.  A full 10 point ROS was otherwise negative.  MEDICAL HISTORY:  Past Medical History:  Diagnosis Date    Cataracts, bilateral    Diabetes mellitus without complication (HCC)    Hypercholesterolemia    Hypertension    Osteopenia     SURGICAL HISTORY: Past Surgical History:  Procedure Laterality Date   OOPHORECTOMY      SOCIAL HISTORY: Social History   Socioeconomic History   Marital status: Widowed    Spouse name: Not on file   Number of children: 2   Years of education: Not on file   Highest education level: Not on file  Occupational History    Employer: [employer]  Tobacco Use   Smoking status: Never   Smokeless tobacco: Not on file  Substance and Sexual Activity   Alcohol use: No   Drug use: Never   Sexual activity: Yes    Partners: Male  Other Topics Concern   Not on file  Social History Narrative   She is a married mother of 2 (daughter-39 and son-42) as of June 2023   She had 2 grandchildren from her daughter   She is relatively active at work, and also does enjoy gardening and walking.   Social Determinants of Health   Financial Resource Strain: Not on file  Food Insecurity: Not on file  Transportation Needs: Not on file  Physical Activity: Not on file  Stress: Not on file  Social Connections: Not on file  Intimate Partner Violence: Not on file    FAMILY HISTORY: Family History  Problem Relation Age of Onset   Hypertension Mother  Diabetes Mellitus II Mother    Lung cancer Mother    Hypertension Father    Heart attack Father 32       Long-term smoker   Coronary artery disease Father 38   Diabetes Mellitus II Father    Rheumatic fever Maternal Grandmother    Valvular heart disease Maternal Grandmother        History of AVR for rheumatic heart disease/ALS    ALLERGIES:  is allergic to other, sulfa antibiotics, and penicillins.  MEDICATIONS:  Current Outpatient Medications  Medication Sig Dispense Refill   albuterol (VENTOLIN HFA) 108 (90 Base) MCG/ACT inhaler Inhale 2 puffs into the lungs every 6 (six) hours as needed.      amLODipine (NORVASC) 5 MG tablet Take 1 tablet by mouth daily.     aspirin EC 81 MG tablet Take 1 tablet by mouth daily.     Calcium Carbonate-Vitamin D 600-3.125 MG-MCG TABS Take by mouth.     Chromium-Cinnamon (CINNAMON PLUS CHROMIUM) [phone] MCG-MG CAPS Take by mouth.     citalopram (CELEXA) 20 MG tablet Take 1 1/2 tablets daily     Cranberry 500 MG CAPS Take by mouth.     cyclobenzaprine (FLEXERIL) 10 MG tablet Take 1 tablet (10 mg total) by mouth 2 (two) times daily as needed for muscle spasms. 20 tablet 0   ezetimibe (ZETIA) 10 MG tablet TAKE 1 TABLET(10 MG) BY MOUTH DAILY     ferrous sulfate 325 (65 FE) MG tablet Take 1 tablet (325 mg total) by mouth daily with breakfast. Please take with a source of Vitamin C 90 tablet 3   Garlic 2000 MG TBEC Take by mouth.     Ginkgo Biloba 40 MG TABS Take by mouth.     hydrocortisone 2.5 % cream APPLY THIN LAYER TOPICALLY TO THE AFFECTED AREA 2 TO 4 TIMES DAILY     LORazepam (ATIVAN) 1 MG tablet TAKE 1/2 TABLET(0.5 MG) BY MOUTH DAILY AS NEEDED     Magnesium 500 MG CAPS Take by mouth.     Misc Natural Products (YUMVS BEET ROOT-TART CHERRY) 250-0.5 MG CHEW Chew by mouth.     omeprazole (PRILOSEC) 40 MG capsule Take 40 mg by mouth daily.     ramipril (ALTACE) 10 MG capsule Take 10 mg by mouth 2 (two) times daily.     SitaGLIPtin-MetFORMIN HCl (JANUMET XR) 50-1000 MG TB24 Take 2 tablets by mouth daily.     Turmeric (QC TUMERIC COMPLEX) 500 MG CAPS Take by mouth.     No current facility-administered medications for this visit.    REVIEW OF SYSTEMS:   Constitutional: ( - ) fevers, ( - )  chills , ( - ) night sweats Eyes: ( - ) blurriness of vision, ( - ) double vision, ( - ) watery eyes Ears, nose, mouth, throat, and face: ( - ) mucositis, ( - ) sore throat Respiratory: ( - ) cough, ( - ) dyspnea, ( - ) wheezes Cardiovascular: ( - ) palpitation, ( - ) chest discomfort, ( - ) lower extremity swelling Gastrointestinal:  ( - ) nausea, ( - ) heartburn,  ( - ) change in bowel habits Skin: ( - ) abnormal skin rashes Lymphatics: ( - ) new lymphadenopathy, ( - ) easy bruising Neurological: ( - ) numbness, ( - ) tingling, ( - ) new weaknesses Behavioral/Psych: ( - ) mood change, ( - ) new changes  All other systems were reviewed with the patient and are negative.  PHYSICAL  EXAMINATION:  Vitals:   03/20/23 1507  BP: (!) 148/77  Pulse: 91  Resp: 16  Temp: 98.5 F (36.9 C)  SpO2: 97%    Filed Weights   03/20/23 1507  Weight: 143 lb 8 oz (65.1 kg)     GENERAL: Well-appearing elderly Caucasian female, alert, no distress and comfortable SKIN: skin color, texture, turgor are normal, no rashes or significant lesions EYES: conjunctiva are pink and non-injected, sclera clear LUNGS: clear to auscultation and percussion with normal breathing effort HEART: regular rate & rhythm and no murmurs and no lower extremity edema Musculoskeletal: no cyanosis of digits and no clubbing  PSYCH: alert & oriented x 3, fluent speech NEURO: no focal motor/sensory deficits  LABORATORY DATA:  I have reviewed the data as listed    Latest Ref Rng & Units 03/13/2023    2:16 PM 01/28/2023    2:19 PM 07/25/2022   10:04 AM  CBC  WBC 4.0 - 10.5 K/uL 8.9  8.3  7.9   Hemoglobin 12.0 - 15.0 g/dL 32.4  40.1  02.7   Hematocrit 36.0 - 46.0 % 39.1  36.9  39.4   Platelets 150 - 400 K/uL 359  396  377        Latest Ref Rng & Units 03/13/2023    2:16 PM 01/28/2023    2:19 PM 07/25/2022   10:04 AM  CMP  Glucose 70 - 99 mg/dL 253  664  403   BUN 8 - 23 mg/dL 9  11  9    Creatinine 0.44 - 1.00 mg/dL 4.74  2.59  5.63   Sodium 135 - 145 mmol/L 138  137  137   Potassium 3.5 - 5.1 mmol/L 4.3  4.2  4.3   Chloride 98 - 111 mmol/L 105  104  101   CO2 22 - 32 mmol/L 27  27  24    Calcium 8.9 - 10.3 mg/dL 9.3  9.4  87.5   Total Protein 6.5 - 8.1 g/dL 6.9  7.1  7.5   Total Bilirubin 0.3 - 1.2 mg/dL 0.3  0.3  0.3   Alkaline Phos 38 - 126 U/L 52  59  68   AST 15 - 41 U/L 17   18  20    ALT 0 - 44 U/L 20  20  23      RADIOGRAPHIC STUDIES: No results found.  ASSESSMENT & PLAN Laquanta Dimanche 72 y.o. female with medical history significant for iron deficiency anemia of unclear etiology who presents for a follow up visit.   After review of the labs, review of the records, and discussion with the patient the patients findings are most consistent with iron deficiency anemia of unclear etiology.  At this time the differential includes GI bleed versus poor p.o. dietary intake.  The patient does believe that she does not intake enough iron rich foods as she does not eat a lot of meat.  She has fecal occult blood testing yearly as she had a bad experience with colonoscopy 13 to 14 years ago.  She is currently due for this testing.  If positive would recommend EGD and colonoscopy for further evaluation.  If negative can continue with monitoring with p.o. iron therapy.  The patient voiced understanding of the plan moving forward.   # Iron Deficiency Anemia 2/2 to Unclear Etiology -- Findings are consistent with iron deficiency anemia secondary an unclear etiology.  --patient has undergone testing with FOB cards, no evidence of GI bleeding. --OK to hold PO  iron therapy due to stomach upset and adequate iron stores. --Labs today show white blood cell count 8.9, hemoglobin 12.9, MCV 84.4, platelets of 359. ferritin was 219 with iron sat 23% -- plan to pursue another round of IV iron therapy if Hgb and iron levels drop again despite PO iron therapy.  --Plan for return to clinic in 6 months with labs 1 week before.   No orders of the defined types were placed in this encounter.   All questions were answered. The patient knows to call the clinic with any problems, questions or concerns.  A total of more than 30 minutes were spent on this encounter with face-to-face time and non-face-to-face time, including preparing to see the patient, ordering tests and/or medications,  counseling the patient and coordination of care as outlined above.   Ulysees Barns, MD Department of Hematology/Oncology [facility] Phone: [phone] Pager: [phone] Email: [email or web address]  03/20/2023 4:44 PM

## 2023-04-30 ENCOUNTER — Other Ambulatory Visit: Payer: Medicare Other

## 2023-04-30 ENCOUNTER — Ambulatory Visit: Payer: Medicare Other | Admitting: Hematology and Oncology

## 2023-09-09 ENCOUNTER — Telehealth: Payer: Self-pay | Admitting: Hematology and Oncology

## 2023-09-17 ENCOUNTER — Other Ambulatory Visit: Payer: Medicare Other

## 2023-09-23 ENCOUNTER — Other Ambulatory Visit: Payer: Self-pay | Admitting: Physician Assistant

## 2023-09-23 DIAGNOSIS — D500 Iron deficiency anemia secondary to blood loss (chronic): Secondary | ICD-10-CM

## 2023-09-24 ENCOUNTER — Ambulatory Visit: Payer: Medicare Other | Admitting: Hematology and Oncology

## 2023-09-24 ENCOUNTER — Inpatient Hospital Stay: Attending: Hematology and Oncology

## 2023-09-24 DIAGNOSIS — D509 Iron deficiency anemia, unspecified: Secondary | ICD-10-CM | POA: Insufficient documentation

## 2023-09-24 DIAGNOSIS — D500 Iron deficiency anemia secondary to blood loss (chronic): Secondary | ICD-10-CM

## 2023-09-24 LAB — CBC WITH DIFFERENTIAL (CANCER CENTER ONLY)
Abs Immature Granulocytes: 0.04 10*3/uL (ref 0.00–0.07)
Basophils Absolute: 0.1 10*3/uL (ref 0.0–0.1)
Basophils Relative: 1 %
Eosinophils Absolute: 0.3 10*3/uL (ref 0.0–0.5)
Eosinophils Relative: 3 %
HCT: 41.7 % (ref 36.0–46.0)
Hemoglobin: 14.1 g/dL (ref 12.0–15.0)
Immature Granulocytes: 0 %
Lymphocytes Relative: 25 %
Lymphs Abs: 2.6 10*3/uL (ref 0.7–4.0)
MCH: 28.8 pg (ref 26.0–34.0)
MCHC: 33.8 g/dL (ref 30.0–36.0)
MCV: 85.3 fL (ref 80.0–100.0)
Monocytes Absolute: 0.6 10*3/uL (ref 0.1–1.0)
Monocytes Relative: 6 %
Neutro Abs: 7 10*3/uL (ref 1.7–7.7)
Neutrophils Relative %: 65 %
Platelet Count: 359 10*3/uL (ref 150–400)
RBC: 4.89 MIL/uL (ref 3.87–5.11)
RDW: 12 % (ref 11.5–15.5)
WBC Count: 10.7 10*3/uL — ABNORMAL HIGH (ref 4.0–10.5)
nRBC: 0 % (ref 0.0–0.2)

## 2023-09-24 LAB — IRON AND IRON BINDING CAPACITY (CC-WL,HP ONLY)
Iron: 76 ug/dL (ref 28–170)
Saturation Ratios: 19 % (ref 10.4–31.8)
TIBC: 403 ug/dL (ref 250–450)
UIBC: 327 ug/dL (ref 148–442)

## 2023-09-24 LAB — FERRITIN: Ferritin: 114 ng/mL (ref 11–307)

## 2023-10-01 ENCOUNTER — Ambulatory Visit: Admitting: Hematology and Oncology

## 2023-10-08 ENCOUNTER — Inpatient Hospital Stay (HOSPITAL_BASED_OUTPATIENT_CLINIC_OR_DEPARTMENT_OTHER): Admitting: Hematology and Oncology

## 2023-10-08 VITALS — BP 138/68 | HR 90 | Temp 97.1°F | Resp 16 | Wt 142.2 lb

## 2023-10-08 DIAGNOSIS — D500 Iron deficiency anemia secondary to blood loss (chronic): Secondary | ICD-10-CM

## 2023-10-08 DIAGNOSIS — D509 Iron deficiency anemia, unspecified: Secondary | ICD-10-CM | POA: Diagnosis not present

## 2023-10-08 NOTE — Progress Notes (Signed)
 [facility] [phone]   [phone]  PROGRESS NOTE  Patient Care Team: Angelia Kelp, NP as PCP - General (Nurse Practitioner) Arleen Lacer, MD as PCP - Cardiology (Cardiology)  Hematological/Oncological History # Iron Deficiency Anemia of Unclear Etiology  01/24/2022: Ferritin 3.0, Sat 4.0%, TIBC 496, Hgb 10.5, MCV 73.7, WBC 7.2, Plt 363 04/11/2022: Establish care with Dr. Rosaline Coma 07/25/2022: white blood cell count 7.9, hemoglobin 13.0, MCV 80.4, and platelets of 377 02/05/2023-02/12/2023: feraheme 510 mg x 2 doses  03/13/2023: labs show white blood cell count 8.9, hemoglobin 12.9, MCV 84.4, platelets of 359. ferritin was 219  Interval History:  Natasha Henry 73 y.o. female with medical history significant for iron deficiency anemia of unclear etiology who presents for a follow up visit. The patient's last visit was on 03/20/2023. In the interim since the last visit she has had no new changes in her health.  On exam today Ms. Oesterling reports she has not been taking her p.o. iron therapy because it causes acid reflux.  She reports her energy levels have been "so-so".  She reports that she is not looking forward to the heat this summer.  She notes her energy today is about a 6 or 7 out of 10.  She is not having any lightheadedness, dizziness, or shortness of breath.  She reports her appetite has been good.  She does that she is eating spinach with some occasional red meat.  She knows she is not having any overt signs of bleeding, bruising, or dark stools.  She denies any recent illnesses with fevers, chills, sweats.  Overall she feels well with no questions concerns or complaints today.  A full 10 point ROS is otherwise negative.  MEDICAL HISTORY:  Past Medical History:  Diagnosis Date   Cataracts, bilateral    Diabetes mellitus without complication (HCC)    Hypercholesterolemia    Hypertension    Osteopenia     SURGICAL HISTORY: Past Surgical  History:  Procedure Laterality Date   OOPHORECTOMY      SOCIAL HISTORY: Social History   Socioeconomic History   Marital status: Widowed    Spouse name: Not on file   Number of children: 2   Years of education: Not on file   Highest education level: Not on file  Occupational History    Employer: [employer]  Tobacco Use   Smoking status: Never   Smokeless tobacco: Not on file  Substance and Sexual Activity   Alcohol use: No   Drug use: Never   Sexual activity: Yes    Partners: Male  Other Topics Concern   Not on file  Social History Narrative   She is a married mother of 2 (daughter-39 and son-42) as of June 2023   She had 2 grandchildren from her daughter   She is relatively active at work, and also does enjoy gardening and walking.   Social Drivers of Corporate investment banker Strain: Not on file  Food Insecurity: Low Risk  (03/18/2023)   Received from [facility]   Hunger Vital Sign    Worried About Running Out of Food in the Last Year: Never true    Ran Out of Food in the Last Year: Never true  Transportation Needs: No Transportation Needs (03/18/2023)   Received from [facility]    In the past 12 months, has lack of reliable transportation kept you from medical appointments, meetings, work or from getting things  needed for daily living? : No  Physical Activity: Not on file  Stress: Not on file  Social Connections: Not on file  Intimate Partner Violence: Not on file    FAMILY HISTORY: Family History  Problem Relation Age of Onset   Hypertension Mother    Diabetes Mellitus II Mother    Lung cancer Mother    Hypertension Father    Heart attack Father 78       Long-term smoker   Coronary artery disease Father 61   Diabetes Mellitus II Father    Rheumatic fever Maternal Grandmother    Valvular heart disease Maternal Grandmother        History of AVR for rheumatic heart disease/ALS    ALLERGIES:  is allergic to other,  sulfa antibiotics, and penicillins.  MEDICATIONS:  Current Outpatient Medications  Medication Sig Dispense Refill   albuterol (VENTOLIN HFA) 108 (90 Base) MCG/ACT inhaler Inhale 2 puffs into the lungs every 6 (six) hours as needed.     amLODipine (NORVASC) 5 MG tablet Take 1 tablet by mouth daily.     aspirin EC 81 MG tablet Take 1 tablet by mouth daily.     Calcium Carbonate-Vitamin D 600-3.125 MG-MCG TABS Take by mouth.     Chromium-Cinnamon (CINNAMON PLUS CHROMIUM) [phone] MCG-MG CAPS Take by mouth.     citalopram (CELEXA) 20 MG tablet Take 1 1/2 tablets daily     Cranberry 500 MG CAPS Take by mouth.     cyclobenzaprine  (FLEXERIL ) 10 MG tablet Take 1 tablet (10 mg total) by mouth 2 (two) times daily as needed for muscle spasms. 20 tablet 0   ezetimibe (ZETIA) 10 MG tablet TAKE 1 TABLET(10 MG) BY MOUTH DAILY     ferrous sulfate  325 (65 FE) MG tablet Take 1 tablet (325 mg total) by mouth daily with breakfast. Please take with a source of Vitamin C 90 tablet 3   Garlic 2000 MG TBEC Take by mouth.     Ginkgo Biloba 40 MG TABS Take by mouth.     hydrocortisone 2.5 % cream APPLY THIN LAYER TOPICALLY TO THE AFFECTED AREA 2 TO 4 TIMES DAILY     LORazepam (ATIVAN) 1 MG tablet TAKE 1/2 TABLET(0.5 MG) BY MOUTH DAILY AS NEEDED     Magnesium 500 MG CAPS Take by mouth.     Misc Natural Products (YUMVS BEET ROOT-TART CHERRY) 250-0.5 MG CHEW Chew by mouth.     omeprazole (PRILOSEC) 40 MG capsule Take 40 mg by mouth daily.     ramipril (ALTACE) 10 MG capsule Take 10 mg by mouth 2 (two) times daily.     SitaGLIPtin-MetFORMIN HCl (JANUMET XR) 50-1000 MG TB24 Take 2 tablets by mouth daily.     Turmeric (QC TUMERIC COMPLEX) 500 MG CAPS Take by mouth.     No current facility-administered medications for this visit.    REVIEW OF SYSTEMS:   Constitutional: ( - ) fevers, ( - )  chills , ( - ) night sweats Eyes: ( - ) blurriness of vision, ( - ) double vision, ( - ) watery eyes Ears, nose, mouth, throat,  and face: ( - ) mucositis, ( - ) sore throat Respiratory: ( - ) cough, ( - ) dyspnea, ( - ) wheezes Cardiovascular: ( - ) palpitation, ( - ) chest discomfort, ( - ) lower extremity swelling Gastrointestinal:  ( - ) nausea, ( - ) heartburn, ( - ) change in bowel habits Skin: ( - ) abnormal skin rashes  Lymphatics: ( - ) new lymphadenopathy, ( - ) easy bruising Neurological: ( - ) numbness, ( - ) tingling, ( - ) new weaknesses Behavioral/Psych: ( - ) mood change, ( - ) new changes  All other systems were reviewed with the patient and are negative.  PHYSICAL EXAMINATION:  Vitals:   10/08/23 1114  BP: 138/68  Pulse: 90  Resp: 16  Temp: (!) 97.1 F (36.2 C)  SpO2: 98%     Filed Weights   10/08/23 1114  Weight: 142 lb 3.2 oz (64.5 kg)      GENERAL: Well-appearing elderly Caucasian female, alert, no distress and comfortable SKIN: skin color, texture, turgor are normal, no rashes or significant lesions EYES: conjunctiva are pink and non-injected, sclera clear LUNGS: clear to auscultation and percussion with normal breathing effort HEART: regular rate & rhythm and no murmurs and no lower extremity edema Musculoskeletal: no cyanosis of digits and no clubbing  PSYCH: alert & oriented x 3, fluent speech NEURO: no focal motor/sensory deficits  LABORATORY DATA:  I have reviewed the data as listed    Latest Ref Rng & Units 09/24/2023    2:42 PM 03/13/2023    2:16 PM 01/28/2023    2:19 PM  CBC  WBC 4.0 - 10.5 K/uL 10.7  8.9  8.3   Hemoglobin 12.0 - 15.0 g/dL 16.1  09.6  04.5   Hematocrit 36.0 - 46.0 % 41.7  39.1  36.9   Platelets 150 - 400 K/uL 359  359  396        Latest Ref Rng & Units 03/13/2023    2:16 PM 01/28/2023    2:19 PM 07/25/2022   10:04 AM  CMP  Glucose 70 - 99 mg/dL 409  811  914   BUN 8 - 23 mg/dL 9  11  9    Creatinine 0.44 - 1.00 mg/dL 7.82  9.56  2.13   Sodium 135 - 145 mmol/L 138  137  137   Potassium 3.5 - 5.1 mmol/L 4.3  4.2  4.3   Chloride 98 - 111 mmol/L  105  104  101   CO2 22 - 32 mmol/L 27  27  24    Calcium 8.9 - 10.3 mg/dL 9.3  9.4  08.6   Total Protein 6.5 - 8.1 g/dL 6.9  7.1  7.5   Total Bilirubin 0.3 - 1.2 mg/dL 0.3  0.3  0.3   Alkaline Phos 38 - 126 U/L 52  59  68   AST 15 - 41 U/L 17  18  20    ALT 0 - 44 U/L 20  20  23      RADIOGRAPHIC STUDIES: No results found.  ASSESSMENT & PLAN Natasha Henry 73 y.o. female with medical history significant for iron deficiency anemia of unclear etiology who presents for a follow up visit.   After review of the labs, review of the records, and discussion with the patient the patients findings are most consistent with iron deficiency anemia of unclear etiology.  At this time the differential includes GI bleed versus poor p.o. dietary intake.  The patient does believe that she does not intake enough iron rich foods as she does not eat a lot of meat.  She has fecal occult blood testing yearly as she had a bad experience with colonoscopy 13 to 14 years ago.  She is currently due for this testing.  If positive would recommend EGD and colonoscopy for further evaluation.  If negative can continue with monitoring  with p.o. iron therapy.  The patient voiced understanding of the plan moving forward.   # Iron Deficiency Anemia 2/2 to Unclear Etiology -- Findings are consistent with iron deficiency anemia secondary an unclear etiology.  --patient has undergone testing with FOB cards, no evidence of GI bleeding. --OK to hold PO iron therapy due to stomach upset and adequate iron stores. --Labs today show white blood cell count 10.7, hemoglobin 14.1, MCV 85.3, platelets 359 -- plan to pursue another round of IV iron therapy if Hgb and iron levels drop again  --Plan for return to clinic in 12 months with labs 1 week before. (1 year per patient request)  No orders of the defined types were placed in this encounter.   All questions were answered. The patient knows to call the clinic with any problems,  questions or concerns.  A total of more than 30 minutes were spent on this encounter with face-to-face time and non-face-to-face time, including preparing to see the patient, ordering tests and/or medications, counseling the patient and coordination of care as outlined above.   Rogerio Clay, MD Department of Hematology/Oncology [facility] at [facility] Phone: [phone] Pager: [phone] Email: [email or web address]  10/08/2023 2:39 PM

## 2024-10-12 ENCOUNTER — Other Ambulatory Visit

## 2024-10-19 ENCOUNTER — Ambulatory Visit: Admitting: Hematology and Oncology
